# Patient Record
Sex: Male | Born: 1945 | Hispanic: Yes | Marital: Married | Smoking: Former smoker
Health system: Southern US, Community
[De-identification: ages and names within clinical notes are randomized; demographics above are authoritative.]

## PROBLEM LIST (undated history)

## (undated) DIAGNOSIS — I10 Essential (primary) hypertension: Secondary | ICD-10-CM

## (undated) DIAGNOSIS — E785 Hyperlipidemia, unspecified: Secondary | ICD-10-CM

## (undated) DIAGNOSIS — E119 Type 2 diabetes mellitus without complications: Secondary | ICD-10-CM

## (undated) DIAGNOSIS — J449 Chronic obstructive pulmonary disease, unspecified: Secondary | ICD-10-CM

## (undated) DIAGNOSIS — I639 Cerebral infarction, unspecified: Secondary | ICD-10-CM

## (undated) HISTORY — DX: Cerebral infarction, unspecified: I63.9

---

## 2017-04-29 ENCOUNTER — Emergency Department (HOSPITAL_COMMUNITY): Payer: Medicare Other

## 2017-04-29 ENCOUNTER — Encounter (HOSPITAL_COMMUNITY): Payer: Self-pay | Admitting: Emergency Medicine

## 2017-04-29 ENCOUNTER — Inpatient Hospital Stay (HOSPITAL_COMMUNITY)
Admission: EM | Admit: 2017-04-29 | Discharge: 2017-05-01 | DRG: 065 | Disposition: A | Discharge status: Home / Self Care | Payer: Medicare Other | Attending: Internal Medicine | Admitting: Internal Medicine

## 2017-04-29 DIAGNOSIS — E119 Type 2 diabetes mellitus without complications: Secondary | ICD-10-CM

## 2017-04-29 DIAGNOSIS — I6523 Occlusion and stenosis of bilateral carotid arteries: Secondary | ICD-10-CM | POA: Diagnosis present

## 2017-04-29 DIAGNOSIS — E1165 Type 2 diabetes mellitus with hyperglycemia: Secondary | ICD-10-CM | POA: Diagnosis present

## 2017-04-29 DIAGNOSIS — I639 Cerebral infarction, unspecified: Secondary | ICD-10-CM | POA: Diagnosis present

## 2017-04-29 DIAGNOSIS — Z7982 Long term (current) use of aspirin: Secondary | ICD-10-CM | POA: Diagnosis not present

## 2017-04-29 DIAGNOSIS — I63512 Cerebral infarction due to unspecified occlusion or stenosis of left middle cerebral artery: Principal | ICD-10-CM | POA: Diagnosis present

## 2017-04-29 DIAGNOSIS — Z87891 Personal history of nicotine dependence: Secondary | ICD-10-CM

## 2017-04-29 DIAGNOSIS — Z6829 Body mass index (BMI) 29.0-29.9, adult: Secondary | ICD-10-CM

## 2017-04-29 DIAGNOSIS — R29703 NIHSS score 3: Secondary | ICD-10-CM | POA: Diagnosis present

## 2017-04-29 DIAGNOSIS — E785 Hyperlipidemia, unspecified: Secondary | ICD-10-CM | POA: Diagnosis present

## 2017-04-29 DIAGNOSIS — I10 Essential (primary) hypertension: Secondary | ICD-10-CM | POA: Diagnosis present

## 2017-04-29 DIAGNOSIS — R4701 Aphasia: Secondary | ICD-10-CM | POA: Diagnosis present

## 2017-04-29 DIAGNOSIS — Z794 Long term (current) use of insulin: Secondary | ICD-10-CM | POA: Diagnosis not present

## 2017-04-29 DIAGNOSIS — Z79899 Other long term (current) drug therapy: Secondary | ICD-10-CM

## 2017-04-29 DIAGNOSIS — Z880 Allergy status to penicillin: Secondary | ICD-10-CM | POA: Diagnosis not present

## 2017-04-29 DIAGNOSIS — R29810 Facial weakness: Secondary | ICD-10-CM | POA: Diagnosis present

## 2017-04-29 DIAGNOSIS — R270 Ataxia, unspecified: Secondary | ICD-10-CM | POA: Diagnosis present

## 2017-04-29 DIAGNOSIS — J449 Chronic obstructive pulmonary disease, unspecified: Secondary | ICD-10-CM | POA: Diagnosis present

## 2017-04-29 DIAGNOSIS — E669 Obesity, unspecified: Secondary | ICD-10-CM | POA: Diagnosis present

## 2017-04-29 DIAGNOSIS — G8194 Hemiplegia, unspecified affecting left nondominant side: Secondary | ICD-10-CM | POA: Diagnosis present

## 2017-04-29 DIAGNOSIS — I6349 Cerebral infarction due to embolism of other cerebral artery: Secondary | ICD-10-CM

## 2017-04-29 HISTORY — DX: Hyperlipidemia, unspecified: E78.5

## 2017-04-29 HISTORY — DX: Chronic obstructive pulmonary disease, unspecified: J44.9

## 2017-04-29 HISTORY — DX: Essential (primary) hypertension: I10

## 2017-04-29 HISTORY — DX: Type 2 diabetes mellitus without complications: E11.9

## 2017-04-29 LAB — I-STAT CHEM 8, ED
BUN: 12 mg/dL (ref 6–20)
CREATININE: 1 mg/dL (ref 0.61–1.24)
Calcium, Ion: 1.11 mmol/L — ABNORMAL LOW (ref 1.15–1.40)
Chloride: 102 mmol/L (ref 101–111)
Glucose, Bld: 200 mg/dL — ABNORMAL HIGH (ref 65–99)
HEMATOCRIT: 40 % (ref 39.0–52.0)
HEMOGLOBIN: 13.6 g/dL (ref 13.0–17.0)
POTASSIUM: 3.9 mmol/L (ref 3.5–5.1)
SODIUM: 140 mmol/L (ref 135–145)
TCO2: 28 mmol/L (ref 22–32)

## 2017-04-29 LAB — COMPREHENSIVE METABOLIC PANEL
ALT: 25 U/L (ref 17–63)
ANION GAP: 11 (ref 5–15)
AST: 25 U/L (ref 15–41)
Albumin: 4.1 g/dL (ref 3.5–5.0)
Alkaline Phosphatase: 36 U/L — ABNORMAL LOW (ref 38–126)
BUN: 13 mg/dL (ref 6–20)
CHLORIDE: 102 mmol/L (ref 101–111)
CO2: 26 mmol/L (ref 22–32)
CREATININE: 1.02 mg/dL (ref 0.61–1.24)
Calcium: 8.8 mg/dL — ABNORMAL LOW (ref 8.9–10.3)
Glucose, Bld: 201 mg/dL — ABNORMAL HIGH (ref 65–99)
POTASSIUM: 3.9 mmol/L (ref 3.5–5.1)
SODIUM: 139 mmol/L (ref 135–145)
Total Bilirubin: 0.8 mg/dL (ref 0.3–1.2)
Total Protein: 7.2 g/dL (ref 6.5–8.1)

## 2017-04-29 LAB — CBC
HEMATOCRIT: 39.4 % (ref 39.0–52.0)
Hemoglobin: 13.8 g/dL (ref 13.0–17.0)
MCH: 31.4 pg (ref 26.0–34.0)
MCHC: 35 g/dL (ref 30.0–36.0)
MCV: 89.5 fL (ref 78.0–100.0)
PLATELETS: 273 10*3/uL (ref 150–400)
RBC: 4.4 MIL/uL (ref 4.22–5.81)
RDW: 13.1 % (ref 11.5–15.5)
WBC: 7.9 10*3/uL (ref 4.0–10.5)

## 2017-04-29 LAB — I-STAT TROPONIN, ED: TROPONIN I, POC: 0 ng/mL (ref 0.00–0.08)

## 2017-04-29 LAB — DIFFERENTIAL
BASOS PCT: 0 %
Basophils Absolute: 0 10*3/uL (ref 0.0–0.1)
EOS ABS: 0 10*3/uL (ref 0.0–0.7)
EOS PCT: 0 %
Lymphocytes Relative: 37 %
Lymphs Abs: 2.9 10*3/uL (ref 0.7–4.0)
MONO ABS: 0.5 10*3/uL (ref 0.1–1.0)
MONOS PCT: 7 %
NEUTROS ABS: 4.4 10*3/uL (ref 1.7–7.7)
Neutrophils Relative %: 56 %

## 2017-04-29 LAB — PROTIME-INR
INR: 0.97
PROTHROMBIN TIME: 12.8 s (ref 11.4–15.2)

## 2017-04-29 LAB — CBG MONITORING, ED: GLUCOSE-CAPILLARY: 188 mg/dL — AB (ref 65–99)

## 2017-04-29 LAB — APTT: APTT: 26 s (ref 24–36)

## 2017-04-29 MED ORDER — SENNOSIDES-DOCUSATE SODIUM 8.6-50 MG PO TABS: 1.0000 | ORAL_TABLET | Freq: Every evening | ORAL | Status: DC | PRN

## 2017-04-29 MED ORDER — ATORVASTATIN CALCIUM 40 MG PO TABS
40.0000 mg | ORAL_TABLET | Freq: Every day | ORAL | Status: DC
  Administered 2017-04-30 – 2017-05-01 (×2): 40 mg via ORAL
  Filled 2017-04-29 (×2): qty 1

## 2017-04-29 MED ORDER — SODIUM CHLORIDE 0.9 % IV SOLN
INTRAVENOUS | Status: DC
  Administered 2017-04-30 (×2): via INTRAVENOUS

## 2017-04-29 MED ORDER — ACETAMINOPHEN 650 MG RE SUPP: 650.0000 mg | RECTAL | Status: DC | PRN

## 2017-04-29 MED ORDER — STROKE: EARLY STAGES OF RECOVERY BOOK
Freq: Once | Status: AC
  Administered 2017-04-30: 02:00:00
  Filled 2017-04-29: qty 1

## 2017-04-29 MED ORDER — ENOXAPARIN SODIUM 40 MG/0.4ML ~~LOC~~ SOLN
40.0000 mg | SUBCUTANEOUS | Status: DC
  Administered 2017-04-30 (×2): 40 mg via SUBCUTANEOUS
  Filled 2017-04-29 (×2): qty 0.4

## 2017-04-29 MED ORDER — INSULIN ASPART 100 UNIT/ML ~~LOC~~ SOLN: 0.0000 [IU] | Freq: Every day | SUBCUTANEOUS | Status: DC

## 2017-04-29 MED ORDER — INSULIN ASPART 100 UNIT/ML ~~LOC~~ SOLN
0.0000 [IU] | Freq: Three times a day (TID) | SUBCUTANEOUS | Status: DC
  Administered 2017-04-30 – 2017-05-01 (×4): 2 [IU] via SUBCUTANEOUS
  Administered 2017-05-01: 5 [IU] via SUBCUTANEOUS
  Administered 2017-05-01: 3 [IU] via SUBCUTANEOUS

## 2017-04-29 MED ORDER — ASPIRIN EC 81 MG PO TBEC
81.0000 mg | DELAYED_RELEASE_TABLET | Freq: Every day | ORAL | Status: DC
  Administered 2017-04-30: 81 mg via ORAL
  Filled 2017-04-29: qty 1

## 2017-04-29 MED ORDER — ACETAMINOPHEN 160 MG/5ML PO SOLN: 650.0000 mg | ORAL | Status: DC | PRN

## 2017-04-29 MED ORDER — ACETAMINOPHEN 325 MG PO TABS: 650.0000 mg | ORAL_TABLET | ORAL | Status: DC | PRN

## 2017-04-29 NOTE — Care Management (Signed)
CM consult reviewed while patient. Patient is being admitted. Care Management will follow for discharge needs. Rubie Maidrystal Diania Co RN CCM

## 2017-04-29 NOTE — ED Notes (Signed)
Patient transported to MRI 

## 2017-04-29 NOTE — ED Notes (Signed)
Called Neurology Hospitalist @ 2018.

## 2017-04-29 NOTE — ED Notes (Signed)
MRI will get patient in roughly 30 minutes

## 2017-04-29 NOTE — ED Notes (Signed)
Attempted to call [facility] (third attempt)

## 2017-04-29 NOTE — ED Notes (Signed)
Attempted to call report to [facility] (second attempt)

## 2017-04-29 NOTE — H&P (Signed)
History and Physical    Mario Sullivan ZOX:[id number] DOB: 1945-08-07 DOA: 04/29/2017  PCP: Maye Hidesaul Canchari-Ames, PA (previously seen at Urgent Care) Consultants:  None Patient coming from:  Home - lives with wife, son, and granddaughter; Jackey LogeOK:  Shari HeritageSon, [phone]  Chief Complaint: weakness, dysarthria  HPI: Mario Sullivan is a 71 y.o. male with medical history significant of HTN, HLD, and DM presenting with symptoms concerning for CVA.  He is Spanish-speaking and history was obtained from his son and via the tele-interpreter.  Last Saturday, they went to his doctor - he was coughing a lot and had lots of phlegm.  He was given medication (prednisone, Tessalon, and Guaifenesin).  Yesterday, they were working outside and he was doing perfectly well.  His son went to his own room for bed about 7pm.  His mother woke up the son this AM about 0300 because the patient felt sick - he was unable to speak and unable to stand.  The [facility] took [facility] to [facility] and they did a CXR and diagnosed him with bronchitis.  He was given medication (Azithromycin) and sent home at 0500.  His son took him home because pharmacies were closed; he went back to get the medications, gave him the drugs, and went back to bed.  He slept but when he awoke his father still felt bad at about 1200.  The son took him back to the doctor and they sent him to the ER for concern of stroke.    At this time, he still having difficulty speaking and difficulty standing on his feet.  Regarding dysarthria, it appears that the issue is slurring (although this is obtained through the interpreter and it is hard to know for sure).  Also possible expressive aphasia.  This is the same problem that he has had since 3am.  RLE and RUE weakness.  This AM, he was drinking some water with his right hand and it felt weak.  He had difficulty with both the cracker and water but was able to eat them; his nurse reports that he passed his swallow  evaluation.  He did not take ASA the last 2 days although he previously was taking it; he ddn't take it because of the URI medications he was prescribed.  He was recently diagnosed with DM.     ED Course:  No tPA.  Negative CT but MRI positive for CVA.  Neurology [facility].  Review of Systems: As per HPI; otherwise review of systems reviewed and negative.   Ambulatory Status:  Ambulated without assistance prior to today  Past Medical History:  Diagnosis Date  . COPD (chronic obstructive pulmonary disease) (HCC)   . Diabetes mellitus type 2 in nonobese (HCC)   . Hyperlipidemia   . Hypertension     History reviewed. No pertinent surgical history.  Social History   Social History  . Marital status: Married    Spouse name: N/A  . Number of children: N/A  . Years of education: N/A   Occupational History  . retired    Social History Main Topics  . Smoking status: Former Smoker    Years: 30.00    Quit date: 141980  . Smokeless tobacco: Never Used  . Alcohol use Yes     Comment: rare; h/o heavy use  . Drug use: No  . Sexual activity: Not on file   Other Topics Concern  . Not on file   Social History Narrative  . No narrative  on file    Allergies  Allergen Reactions  . Penicillins     Has patient had a PCN reaction causing immediate rash, facial/tongue/throat swelling, SOB or lightheadedness with hypotension: Unknown Has patient had a PCN reaction causing severe rash involving mucus membranes or skin necrosis: Unknown Has patient had a PCN reaction that required hospitalization: Unknown Has patient had a PCN reaction occurring within the last 10 years: Unknown If all of the above answers are "NO", then may proceed with Cephalosporin use.     Family History  Problem Relation Age of Onset  . CVA Neg Hx     Prior to Admission medications   Medication Sig Start Date End Date Taking? Authorizing Provider  albuterol (PROAIR HFA) 108 (90  Base) MCG/ACT inhaler Inhale 2 puffs into the lungs 2 (two) times daily.   Yes [provider]  aspirin 81 MG tablet Take 81 mg by mouth daily.   Yes [provider]  azithromycin (ZITHROMAX) 250 MG tablet Take 250 mg by mouth daily. Take 2 tablets by mouth on first day, then take one tablet daily for 4 days   Yes [provider]  benzonatate (TESSALON) 200 MG capsule Take 200 mg by mouth 3 (three) times daily as needed for cough.   Yes [provider]  guaiFENesin (MUCINEX) 600 MG 12 hr tablet Take 600 mg by mouth 2 (two) times daily.   Yes [provider]  Guaifenesin-Codeine (VIRTUSSIN A/C PO) Take 10 mLs by mouth. TAKE 10 ML BY MOUTH EVERY 6 TO 8 HOURS AS NEEDED FOR COUGH   Yes [provider]  lisinopril (PRINIVIL,ZESTRIL) 10 MG tablet Take 10 mg by mouth daily.   Yes [provider]  lovastatin (MEVACOR) 10 MG tablet Take 10 mg by mouth daily.   Yes [provider]  metFORMIN (GLUCOPHAGE) 1000 MG tablet Take 1,000 mg by mouth 2 (two) times daily with a meal.   Yes [provider]  predniSONE (DELTASONE) 20 MG tablet Take 40 mg by mouth daily.   Yes [provider]    Physical Exam: Vitals:   04/29/17 2011 04/29/17 2048 04/29/17 2130 04/29/17 2233  BP:  (!) 142/77 (!) 150/77 (!) 143/66  Pulse:  74 64 (!) 58  Resp:  18 (!) 22 20  Temp: 98 F (36.7 C)   98.9 F (37.2 C)  TempSrc:    Oral  SpO2:  100% 99% 99%  Weight:    68.7 kg (151 lb 6.4 oz)  Height:    5' (1.524 m)     General:  Appears calm and comfortable and is NAD Eyes:  PERRL, EOMI, normal lids, iris ENT:  grossly normal hearing, lips & tongue, mmm; there is mild tongue deviation to the right Neck:  no LAD, masses or thyromegaly; no carotid bruits (vs. Very mild bruit on the left) Cardiovascular:  RRR, no m/r/g. No LE edema.  Respiratory:   CTA bilaterally with no wheezes/rales/rhonchi.  Normal respiratory effort. Abdomen:  soft, NT,  ND, NABS Skin:  no rash or induration seen on limited exam Musculoskeletal:  RLE weakness particularly of the foot with flexion/extension but present through the hip girdle; also with RUE weakness although grip strength is preserved Lower extremity:  No LE edema.  Limited foot exam with no ulcerations.  2+ distal pulses. Psychiatric:  grossly normal mood and affect, speech fluent and appropriate - possibly mildly slurred with intermittent mild aphasia, but this is extremely hard to interpret due to the  language barrier, AOx3 Neurologic:  CN 2-12 grossly intact other than mild V3 weakness on the right, moves all extremities in coordinated fashion, sensation diminished on the left face (all distributions) and arm, leg    Radiological Exams on Admission: Ct Head Wo Contrast  Result Date: 04/29/2017 CLINICAL DATA:  Slurred speech, left-sided weakness and fatigue. EXAM: CT HEAD WITHOUT CONTRAST TECHNIQUE: Contiguous axial images were obtained from the base of the skull through the vertex without intravenous contrast. COMPARISON:  None. FINDINGS: Brain: There is evidence of a probable subacute deep infarct involving the superior aspect of the left lentiform nucleus and extending up towards the corona radiata. No associated hemorrhage or visible mass-effect. No hydrocephalus. No extra-axial fluid collections. Vascular: No hyperdense vessel or unexpected calcification. Skull: Normal. Negative for fracture or focal lesion. Sinuses/Orbits: No acute finding. Other: None. IMPRESSION: Probable subacute infarct involving the basal ganglia at the level of the left lentiform nucleus and extending superiorly up towards the corona radiata. No evidence of associated hemorrhage. Electronically Signed   By: Irish Lack M.D.   On: 04/29/2017 17:06   Mr Brain Wo Contrast (neuro Protocol)  Result Date: 04/29/2017 CLINICAL DATA:  71 y/o  M; stroke-like symptoms. EXAM: MRI HEAD WITHOUT CONTRAST TECHNIQUE: Multiplanar,  multiecho pulse sequences of the brain and surrounding structures were obtained without intravenous contrast. COMPARISON:  04/29/2017 CT of the head. FINDINGS: Brain: Area of reduced diffusion involving the left superior putamen, caudate head, and caudate body measuring 3.4 x 1.2 x 2.1 cm (volume = 4.5 cm^3) (AP x ML x CC series 3, image 34 and series 5, image 18) compatible with acute/early subacute infarction. The infarct demonstrates associated T2 FLAIR hyperintense signal abnormality. No abnormal susceptibility to indicate intracranial hemorrhage. Few nonspecific foci of T2 FLAIR hyperintense signal abnormality in subcortical and periventricular white matter are compatible with mild chronic microvascular ischemic changes for age. Mild brain parenchymal volume loss. Small chronic infarct within the right lateral cerebellar hemisphere. No hydrocephalus, extra-axial collection, or effacement of basilar cisterns. Vascular: Normal flow voids. Skull and upper cervical spine: Normal marrow signal. Sinuses/Orbits: Negative. Other: None. IMPRESSION: 1. Small acute/early subacute infarction involving left superior putamen and caudate nucleus. No acute hemorrhage. 2. Mild chronic microvascular ischemic changes and mild parenchymal volume loss of the brain. These results were called by telephone at the time of interpretation on 04/29/2017 at 7:54 pm to Dr. Lorre Nick , who verbally acknowledged these results. Electronically Signed   By: Mitzi Hansen M.D.   On: 04/29/2017 19:56    EKG: Independently reviewed.  NSR with rate 66; nonspecific ST changes with no evidence of acute ischemia   Labs on Admission: I have personally reviewed the available labs and imaging studies at the time of the admission.  Pertinent labs:   Glucose 188, 200 Troponin 0.00   Assessment/Plan Principal Problem:   CVA (cerebral vascular accident) (HCC) Active Problems:   Essential hypertension   Diabetes mellitus, type  2 (HCC)   Hyperlipidemia   CVA -Symptoms quite concerning for CVA; unfortunately, while he may have been a tPA candidate with his initial presentation, he is not currently (I suspect that the language barrier contributed to the issue) -MRI is positive for CVA -Will admit to [facility] for further CVA/TIA evaluation -Telemetry monitoring -MRA -Carotid dopplers -Echo -Risk stratification with FLP, A1c; will also check TSH and UDS -ASA daily (while he was taking this daily, he missed the last few days.  It is my understanding that with sudden  withdrawal of ASA there can be a rebound effect that increases the risk of CVA transiently). -PT/OT/ST/Nutrition Consults -SW consult for placement - he appears to be an excellent candidate for inpatient rehab -Neurology consult upon arrival at [facility]  HTN -Allow permissive HTN -Treat BP only if >220/120, and then with goal of 15% reduction -Hold  Lisinopril and plan to restart in 48-72 hours  HLD -Check FLP -Change Mevacor to Lipitor 40 mg daily empirically for now  DM -Will check A1c -hold Glucophage -Cover with moderate-scale SSI   DVT prophylaxis: Lovenox  Code Status:  Full - confirmed with patient/family Family Communication: Son present throughout evaluation  Disposition Plan:  To be determined - recommend inpatient rehab if eligible Consults called: Neurology; PT/OT/ST/Nutrition/SW  Admission status: Admit - It is my clinical opinion that admission to INPATIENT is reasonable and necessary because this patient will require at least 2 midnights in the hospital to treat this condition based on the medical complexity of the problems presented.  Given the aforementioned information, the predictability of an adverse outcome is felt to be significant.    Jonah Blue MD [facility]  If note is complete, please contact covering daytime or nighttime physician. [email or web address] Password [email or web address]  04/29/2017, 11:23 PM

## 2017-04-29 NOTE — ED Notes (Signed)
[facility] contacted for transport to [place] 

## 2017-04-29 NOTE — ED Triage Notes (Signed)
Pt comes in with son with complaints of stroke like symptoms that began around 0300 this morning.  Was seen and diagnosed with acute bronchitis recently.  Pt went and saw spanish speaking doctor and was sent over here urgently to rule out stroke. According to son patient is slurring his speech and has left sided weakness with increased fatigue. Pt presents with equal motor skills bilaterally for this RN.  Pt does have some slight asymmetry to smile.

## 2017-04-29 NOTE — ED Notes (Signed)
Attempted to call report to [facility]

## 2017-04-29 NOTE — ED Provider Notes (Signed)
[facility]-EMERGENCY DEPT Provider Note   CSN: [id number] Arrival date & time: 04/29/17  1612     History   Chief Complaint Chief Complaint  Patient presents with  . Stroke Like Symptoms    HPI Mario Sullivan is a 71 y.o. male.  71 year old male presents with ataxia, slurred speech, right-sided weakness since approximately 3 AM today.  Went to an outside hospital and was diagnosed with bronchitis.  Of note, he did not have any cough, fever, chest pain.  He was prescribed azithromycin as well as cough medication and discharged home.  His son states that he continues to be ataxic when he walks and speech is garbled.  He does appear to understand him his son states.  No reported confusion, emesis.  He has not had any falls.  He denied any visual changes.  Symptoms have been persistent and nothing makes them better.  No treatment use prior to arrival      Past Medical History:  Diagnosis Date  . COPD (chronic obstructive pulmonary disease) (HCC)     There are no active problems to display for this patient.   History reviewed. No pertinent surgical history.     Home Medications    Prior to Admission medications   Medication Sig Start Date End Date Taking? Authorizing Provider  azithromycin (ZITHROMAX) 250 MG tablet Take 250 mg by mouth daily. Take 2 tablets by mouth on first day, then take one tablet daily for 4 days   Yes [provider]  Guaifenesin-Codeine (VIRTUSSIN A/C PO) Take 10 mLs by mouth. TAKE 10 ML BY MOUTH EVERY 6 TO 8 HOURS AS NEEDED FOR COUGH   Yes [provider]  metFORMIN (GLUCOPHAGE) 1000 MG tablet Take 1,000 mg by mouth 2 (two) times daily with a meal.   Yes [provider]    Family History No family history on file.  Social History Social History  Substance Use Topics  . Smoking status: Former Games developer  . Smokeless tobacco: Never Used  . Alcohol use No     Allergies   Penicillins   Review  of Systems Review of Systems  All other systems reviewed and are negative.    Physical Exam Updated Vital Signs BP (!) 154/67 (BP Location: Left Arm)   Pulse 69   Temp 97.8 F (36.6 C) (Oral)   Resp 16   SpO2 97%   Physical Exam  Constitutional: He is oriented to person, place, and time. He appears well-developed and well-nourished.  Non-toxic appearance. No distress.  HENT:  Head: Normocephalic and atraumatic.  Eyes: Pupils are equal, round, and reactive to light. Conjunctivae, EOM and lids are normal.  Neck: Normal range of motion. Neck supple. No tracheal deviation present. No thyroid mass present.  Cardiovascular: Normal rate, regular rhythm and normal heart sounds.  Exam reveals no gallop.   No murmur heard. Pulmonary/Chest: Effort normal and breath sounds normal. No stridor. No respiratory distress. He has no decreased breath sounds. He has no wheezes. He has no rhonchi. He has no rales.  Abdominal: Soft. Normal appearance and bowel sounds are normal. He exhibits no distension. There is no tenderness. There is no rebound and no CVA tenderness.  Musculoskeletal: Normal range of motion. He exhibits no edema or tenderness.  Neurological: He is alert and oriented to person, place, and time. He has normal strength. No cranial nerve deficit or sensory deficit. GCS eye subscore is 4. GCS verbal subscore is 5. GCS motor subscore is 6.  Right upper and right lower extremity strength is 4/ 5 No facial asymmetry, speech is not garbled.  Skin: Skin is warm and dry. No abrasion and no rash noted.  Psychiatric: His affect is blunt. His speech is delayed. He is slowed.  Nursing note and vitals reviewed.    ED Treatments / Results  Labs (all labs ordered are listed, but only abnormal results are displayed) Labs Reviewed  CBG MONITORING, ED - Abnormal; Notable for the following:       Result Value   Glucose-Capillary 188 (*)    All other components within normal limits  I-STAT CHEM 8,  ED - Abnormal; Notable for the following:    Glucose, Bld 200 (*)    Calcium, Ion 1.11 (*)    All other components within normal limits  CBC  DIFFERENTIAL  PROTIME-INR  APTT  COMPREHENSIVE METABOLIC PANEL  I-STAT TROPONIN, ED    EKG  EKG Interpretation  Date/Time:  Monday April 29 2017 16:33:25 EDT Ventricular Rate:  66 PR Interval:    QRS Duration: 90 QT Interval:  385 QTC Calculation: 404 R Axis:   62 Text Interpretation:  Sinus rhythm Borderline low voltage, extremity leads No old tracing to compare Confirmed by Lorre NickAllen, Avaree Gilberti ([id number]) on 04/29/2017 5:06:28 PM       Radiology No results found.  Procedures Procedures (including critical care time)  Medications Ordered in ED Medications - No data to display   Initial Impression / Assessment and Plan / ED Course  I have reviewed the triage vital signs and the nursing notes.  Pertinent labs & imaging results that were available during my care of the patient were reviewed by me and considered in my medical decision making (see chart for details).     Patient is a Van negative.  Symptoms began more than 4-1/2 hours ago.  Head CT negative but MRI does show positive CVA.  Discussed with Dr. Amada JupiterKirkpatrick who [facility] and I will contact the hospitalist here  Final Clinical Impressions(s) / ED Diagnoses   Final diagnoses:  None    New Prescriptions New Prescriptions   No medications on file     Lorre NickAllen, Bani Gianfrancesco, MD 04/29/17 2029

## 2017-04-29 NOTE — ED Notes (Signed)
ED Provider at bedside. 

## 2017-04-30 ENCOUNTER — Inpatient Hospital Stay (HOSPITAL_COMMUNITY): Payer: Medicare Other

## 2017-04-30 DIAGNOSIS — I6789 Other cerebrovascular disease: Secondary | ICD-10-CM

## 2017-04-30 DIAGNOSIS — I63312 Cerebral infarction due to thrombosis of left middle cerebral artery: Secondary | ICD-10-CM

## 2017-04-30 LAB — ECHOCARDIOGRAM COMPLETE
AVLVOTPG: 8 mmHg
CHL CUP DOP CALC LVOT VTI: 34.1 cm
EERAT: 12.99
FS: 23 % — AB (ref 28–44)
Height: 60 in
IVS/LV PW RATIO, ED: 1.08
LA ID, A-P, ES: 33 mm
LA diam index: 1.91 cm/m2
LA vol A4C: 60.4 ml
LA vol index: 35.4 mL/m2
LA vol: 61.2 mL
LDCA: 3.14 cm2
LEFT ATRIUM END SYS DIAM: 33 mm
LV E/e'average: 12.99
LV PW d: 12 mm — AB (ref 0.6–1.1)
LV TDI E'LATERAL: 8.16
LVEEMED: 12.99
LVELAT: 8.16 cm/s
LVOT diameter: 20 mm
LVOTPV: 144 cm/s
LVOTSV: 107 mL
MV Peak grad: 4 mmHg
MV pk A vel: 121 m/s
MV pk E vel: 106 m/s
RV LATERAL S' VELOCITY: 15.7 cm/s
RV TAPSE: 24.4 mm
TDI e' medial: 7.62
Weight: 2422.4 oz

## 2017-04-30 LAB — BASIC METABOLIC PANEL
Anion gap: 9 (ref 5–15)
BUN: 11 mg/dL (ref 6–20)
CHLORIDE: 104 mmol/L (ref 101–111)
CO2: 26 mmol/L (ref 22–32)
Calcium: 8.6 mg/dL — ABNORMAL LOW (ref 8.9–10.3)
Creatinine, Ser: 0.97 mg/dL (ref 0.61–1.24)
Glucose, Bld: 139 mg/dL — ABNORMAL HIGH (ref 65–99)
POTASSIUM: 3.9 mmol/L (ref 3.5–5.1)
SODIUM: 139 mmol/L (ref 135–145)

## 2017-04-30 LAB — GLUCOSE, CAPILLARY
GLUCOSE-CAPILLARY: 134 mg/dL — AB (ref 65–99)
GLUCOSE-CAPILLARY: 141 mg/dL — AB (ref 65–99)
GLUCOSE-CAPILLARY: 176 mg/dL — AB (ref 65–99)
Glucose-Capillary: 146 mg/dL — ABNORMAL HIGH (ref 65–99)
Glucose-Capillary: 227 mg/dL — ABNORMAL HIGH (ref 65–99)
Glucose-Capillary: 145 mg/dL — ABNORMAL HIGH (ref 65–99)

## 2017-04-30 LAB — TROPONIN I

## 2017-04-30 LAB — LIPID PANEL
Cholesterol: 146 mg/dL (ref 0–200)
HDL: 43 mg/dL (ref >40)
LDL CALC: 76 mg/dL (ref 0–99)
TRIGLYCERIDES: 134 mg/dL (ref <150)
Total CHOL/HDL Ratio: 3.4 RATIO
VLDL: 27 mg/dL (ref 0–40)

## 2017-04-30 LAB — HEMOGLOBIN A1C
Hgb A1c MFr Bld: 7.9 % — ABNORMAL HIGH (ref 4.8–5.6)
Mean Plasma Glucose: 180.03 mg/dL

## 2017-04-30 MED ORDER — ORAL CARE MOUTH RINSE
15.0000 mL | Freq: Two times a day (BID) | OROMUCOSAL | Status: DC
  Administered 2017-04-30 – 2017-05-01 (×4): 15 mL via OROMUCOSAL

## 2017-04-30 MED ORDER — CHLORHEXIDINE GLUCONATE 0.12 % MT SOLN
15.0000 mL | Freq: Two times a day (BID) | OROMUCOSAL | Status: DC
Start: 2017-04-30 — End: 2017-05-01
  Administered 2017-04-30 – 2017-05-01 (×3): 15 mL via OROMUCOSAL
  Filled 2017-04-30 (×3): qty 15

## 2017-04-30 MED ORDER — ASPIRIN EC 325 MG PO TBEC
325.0000 mg | DELAYED_RELEASE_TABLET | Freq: Every day | ORAL | Status: DC
  Administered 2017-04-30 – 2017-05-01 (×2): 325 mg via ORAL
  Filled 2017-04-30 (×2): qty 1

## 2017-04-30 NOTE — Progress Notes (Signed)
[facility]                                                                              Patient Demographics  Mario Sullivan, is a 71 y.o. male, DOB - 07-27-1945, ZOX:[id number]  Admit date - 04/29/2017   Admitting Physician Jonah Blue, MD  Outpatient Primary MD for the patient is Kurtis Bushman, [place]  Outpatient specialists:   LOS - 1  days   Medical records reviewed and are as summarized below:    Chief Complaint  Patient presents with  . Stroke Like Symptoms       Brief summary   Patient is a 71 year old male with hypertension, hyperlipidemia, diabetes presented with generalized weakness, slurred speech.  Per patient's son he was recently diagnosed with bronchitis and improving on prednisone, Tessalon and guaifenesin.  On the morning of admission, patient was found to have slurred speech and difficulty standing on his feet.  Patient was admitted for further workup of stroke.   Assessment & Plan    Principal Problem:  acute CVA (cerebral vascular accident) (HCC) -Presented with slurred speech and weakness -MRI of the brain showed small acute/early subacute infarction involving the left superior putamen and caudate nucleus, no acute hemorrhage. -MRI of the brain showed severe stenosis versus focally occluded left M2 segments, occluded left M3 segment -Follow 2D echo, carotid Dopplers, PT, OT, ST evaluation -Currently on aspirin 325 mg daily, follow neurology recommendations -LDL 76, continue statin -Hemoglobin A1c 7.9  Active Problems:   Essential hypertension -Allow permissive hypertension, hold lisinopril, plan to restart in next 48-72 hours     Diabetes mellitus, type 2 (HCC) -Hold metformin, continue sliding scale insulin -Hemoglobin A1c 9.9    Hyperlipidemia -Lipid panel showed LDL 76, continue Lipitor for now  Code Status:  DVT Prophylaxis:  Lovenox  Family Communication: Discussed in detail with the patient, all imaging  results, lab results explained to the patient and son at the bedside   Disposition Plan:   Time Spent in minutes   *25 minutes  Procedures:    Consultants:   neurology  Antimicrobials:      Medications  Scheduled Meds: . aspirin EC  325 mg Oral Daily  . atorvastatin  40 mg Oral q1800  . chlorhexidine  15 mL Mouth Rinse BID  . enoxaparin (LOVENOX) injection  40 mg Subcutaneous Q24H  . insulin aspart  0-15 Units Subcutaneous TID WC  . insulin aspart  0-5 Units Subcutaneous QHS  . mouth rinse  15 mL Mouth Rinse q12n4p   Continuous Infusions: . sodium chloride 50 mL/hr at 04/30/17 0139   PRN Meds:.acetaminophen **OR** acetaminophen (TYLENOL) oral liquid 160 mg/5 mL **OR** acetaminophen, senna-docusate   Antibiotics   Anti-infectives    None        Subjective:   Mario Sullivan was seen and examined today.  Speech is slow but improving per son at the bedside.  Mild weakness on the right.  Patient denies dizziness, chest pain, shortness of breath, abdominal pain, N/V/D/C.  No acute events overnight.    Objective:   Vitals:   04/30/17 0100 04/30/17 0300 04/30/17 0520 04/30/17 0845  BP: 129/63 127/65 (!) 145/64 (!) 143/63  Pulse: (!) 58 (!) 58 60 74  Resp: 16 14 16 16   Temp: 98.4 F (36.9 C) 98.9 F (37.2 C) (!) 97.5 F (36.4 C) 98.3 F (36.8 C)  TempSrc: Oral Oral Axillary Oral  SpO2: 97% 97% 98% 97%  Weight:      Height:       No intake or output data in the 24 hours ending 04/30/17 1132   Wt Readings from Last 3 Encounters:  04/29/17 68.7 kg (151 lb 6.4 oz)  04/29/17 67.1 kg (148 lb)     Exam  General: Alert and oriented x 3, NAD  Eyes:   HEENT:  Atraumatic, normocephalic  Cardiovascular: S1 S2 auscultated, no rubs, murmurs or gallops. Regular rate and rhythm.  Respiratory: Clear to auscultation bilaterally, no wheezing, rales or rhonchi  Gastrointestinal: Soft, nontender, nondistended, + bowel sounds  Ext: no pedal edema  bilaterally  Neuro: AAOx3, Cr N's II- XII. Strength 5/5 LUE, LLE. 4/5 on the right, speech improving  Musculoskeletal: No digital cyanosis, clubbing  Skin: No rashes  Psych: Normal affect and demeanor, alert and oriented x3    Data Reviewed:  I have personally reviewed following labs and imaging studies  Micro Results No results found for this or any previous visit (from the past 240 hour(s)).  Radiology Reports Ct Head Wo Contrast  Result Date: 04/29/2017 CLINICAL DATA:  Slurred speech, left-sided weakness and fatigue. EXAM: CT HEAD WITHOUT CONTRAST TECHNIQUE: Contiguous axial images were obtained from the base of the skull through the vertex without intravenous contrast. COMPARISON:  None. FINDINGS: Brain: There is evidence of a probable subacute deep infarct involving the superior aspect of the left lentiform nucleus and extending up towards the corona radiata. No associated hemorrhage or visible mass-effect. No hydrocephalus. No extra-axial fluid collections. Vascular: No hyperdense vessel or unexpected calcification. Skull: Normal. Negative for fracture or focal lesion. Sinuses/Orbits: No acute finding. Other: None. IMPRESSION: Probable subacute infarct involving the basal ganglia at the level of the left lentiform nucleus and extending superiorly up towards the corona radiata. No evidence of associated hemorrhage. Electronically Signed   By: Irish Lack M.D.   On: 04/29/2017 17:06   Mr Brain Wo Contrast (neuro Protocol)  Result Date: 04/29/2017 CLINICAL DATA:  71 y/o  M; stroke-like symptoms. EXAM: MRI HEAD WITHOUT CONTRAST TECHNIQUE: Multiplanar, multiecho pulse sequences of the brain and surrounding structures were obtained without intravenous contrast. COMPARISON:  04/29/2017 CT of the head. FINDINGS: Brain: Area of reduced diffusion involving the left superior putamen, caudate head, and caudate body measuring 3.4 x 1.2 x 2.1 cm (volume = 4.5 cm^3) (AP x ML x CC series 3, image  34 and series 5, image 18) compatible with acute/early subacute infarction. The infarct demonstrates associated T2 FLAIR hyperintense signal abnormality. No abnormal susceptibility to indicate intracranial hemorrhage. Few nonspecific foci of T2 FLAIR hyperintense signal abnormality in subcortical and periventricular white matter are compatible with mild chronic microvascular ischemic changes for age. Mild brain parenchymal volume loss. Small chronic infarct within the right lateral cerebellar hemisphere. No hydrocephalus, extra-axial collection, or effacement of basilar cisterns. Vascular: Normal flow voids. Skull and upper cervical spine: Normal marrow signal. Sinuses/Orbits: Negative. Other: None. IMPRESSION: 1. Small acute/early subacute infarction involving left superior putamen and caudate nucleus. No acute hemorrhage. 2. Mild chronic microvascular ischemic changes and mild parenchymal volume loss of the brain. These results were called by telephone at the time of interpretation on 04/29/2017 at 7:54  pm to Dr. Lorre NickANTHONY ALLEN , who verbally acknowledged these results. Electronically Signed   By: Mitzi HansenLance  Furusawa-Stratton M.D.   On: 04/29/2017 19:56   Mr Maxine GlennMra Head Wo Contrast  Result Date: 04/30/2017 CLINICAL DATA:  Follow-up stroke. EXAM: MRA HEAD WITHOUT CONTRAST TECHNIQUE: Angiographic images of the Circle of Willis were obtained using MRA technique without intravenous contrast. COMPARISON:  MRI of the head April 29, 2017 FINDINGS: ANTERIOR CIRCULATION: Normal flow related enhancement of the included cervical, petrous, cavernous and supraclinoid internal carotid arteries. Patent anterior communicating artery. Poor flow related artifact LEFT M2 inferior division though, normal caliber. Focal loss of LEFT M2 superior division flow related enhancement, patent distally. Occluded LEFT M3 segment within anterior sylvian fissure. Patent anterior cerebral artery's. No aneurysm. POSTERIOR CIRCULATION: RIGHT  vertebral artery is dominant. Basilar artery is patent, with normal flow related enhancement of the main branch vessels. Patent posterior cerebral arteries. LEFT posterior communicating artery infundibulum. No large vessel occlusion, flow limiting stenosis,  aneurysm. ANATOMIC VARIANTS: None. Source images and MIP images were reviewed. IMPRESSION: 1. Severe stenosis versus focally occluded LEFT M2 segments, possibly overestimated by tortuosity. 2. Occluded LEFT M3 segment. Electronically Signed   By: Awilda Metroourtnay  Bloomer M.D.   On: 04/30/2017 02:02    Lab Data:  CBC:  Recent Labs Lab 04/29/17 1632 04/29/17 1644  WBC 7.9  --   NEUTROABS 4.4  --   HGB 13.8 13.6  HCT 39.4 40.0  MCV 89.5  --   PLT 273  --    Basic Metabolic Panel:  Recent Labs Lab 04/29/17 1632 04/29/17 1644 04/30/17 0712  NA 139 140 139  K 3.9 3.9 3.9  CL 102 102 104  CO2 26  --  26  GLUCOSE 201* 200* 139*  BUN 13 12 11   CREATININE 1.02 1.00 0.97  CALCIUM 8.8*  --  8.6*   GFR: Estimated Creatinine Clearance: 57.6 mL/min (by C-G formula based on SCr of 0.97 mg/dL). Liver Function Tests:  Recent Labs Lab 04/29/17 1632  AST 25  ALT 25  ALKPHOS 36*  BILITOT 0.8  PROT 7.2  ALBUMIN 4.1   No results for input(s): LIPASE, AMYLASE in the last 168 hours. No results for input(s): AMMONIA in the last 168 hours. Coagulation Profile:  Recent Labs Lab 04/29/17 1632  INR 0.97   Cardiac Enzymes:  Recent Labs Lab 04/30/17 0116  TROPONINI <0.03   BNP (last 3 results) No results for input(s): PROBNP in the last 8760 hours. HbA1C:  Recent Labs  04/30/17 0116  HGBA1C 7.9*   CBG:  Recent Labs Lab 04/29/17 1641 04/30/17 0116 04/30/17 0447 04/30/17 0745 04/30/17 1122  GLUCAP 188* 146* 227* 134* 141*   Lipid Profile:  Recent Labs  04/30/17 0351  CHOL 146  HDL 43  LDLCALC 76  TRIG 134  CHOLHDL 3.4   Thyroid Function Tests: No results for input(s): TSH, T4TOTAL, FREET4, T3FREE, THYROIDAB  in the last 72 hours. Anemia Panel: No results for input(s): VITAMINB12, FOLATE, FERRITIN, TIBC, IRON, RETICCTPCT in the last 72 hours. Urine analysis: No results found for: COLORURINE, APPEARANCEUR, LABSPEC, PHURINE, GLUCOSEU, HGBUR, BILIRUBINUR, KETONESUR, PROTEINUR, UROBILINOGEN, NITRITE, LEUKOCYTESUR   Ripudeep Rai M.D. [facility] 04/30/2017, 11:32 AM  Pager: [phone] Between 7am to 7pm - call Pager - [phone]  After 7pm go to [email or web address] - password [email or web address]  Call night coverage person covering after 7pm

## 2017-04-30 NOTE — Consult Note (Signed)
Neurology Consultation Reason for Consult: Ischemic stroke Referring Physician: Basil Dess  CC: Ischemic stroke  History is obtained from: Patient via family member  HPI: Mario Sullivan is a 71 y.o. male who has been treated for bronchitis recently.  He was in his normal state of health until yesterday when he went to bed.  He woke up around 3 AM, it was noted that he was slurring his speech.  This persisted and he went to his PCP today who referred him to the emergency department for concern for stroke.  An MRI was performed which does show a deep stroke on the left.   LKW: 10/29 prior to bed tpa given?: no, out of window  ROS: A 14 point ROS was performed and is negative except as noted in the HPI.  Unable to obtain due to altered mental status.   Past Medical History:  Diagnosis Date  . COPD (chronic obstructive pulmonary disease) (HCC)   . Diabetes mellitus type 2 in nonobese (HCC)   . Hyperlipidemia   . Hypertension      Family History  Problem Relation Age of Onset  . CVA Neg Hx      Social History:  reports that he quit smoking about 38 years ago. He quit after 30.00 years of use. He has never used smokeless tobacco. He reports that he drinks alcohol. He reports that he does not use drugs.   Exam: Current vital signs: BP 129/63 (BP Location: Left Arm)   Pulse (!) 58   Temp 98.4 F (36.9 C) (Oral)   Resp 16   Ht 5' (1.524 m)   Wt 68.7 kg (151 lb 6.4 oz)   SpO2 97%   BMI 29.57 kg/m  Vital signs in last 24 hours: Temp:  [97.8 F (36.6 C)-98.9 F (37.2 C)] 98.4 F (36.9 C) (10/30 0100) Pulse Rate:  [58-74] 58 (10/30 0100) Resp:  [15-22] 16 (10/30 0100) BP: (129-154)/(63-86) 129/63 (10/30 0100) SpO2:  [97 %-100 %] 97 % (10/30 0100) Weight:  [67.1 kg (148 lb)-68.7 kg (151 lb 6.4 oz)] 68.7 kg (151 lb 6.4 oz) (10/29 2233)   Physical Exam  Constitutional: Appears well-developed and well-nourished.  Psych: Affect appropriate to situation Eyes: No scleral  injection HENT: No OP obstrucion Head: Normocephalic.  Cardiovascular: Normal rate and regular rhythm.  Respiratory: Effort normal and breath sounds normal to anterior ascultation GI: Soft.  No distension. There is no tenderness.  Skin: WDI  Neuro: Mental Status: Patient is awake, alert, oriented to person, place, month, year, and situation. Patient is able to give a clear and coherent history. No signs of aphasia or neglect Cranial Nerves: II: Visual Fields are full. Pupils are equal, round, and reactive to light.   III,IV, VI: EOMI without ptosis or diploplia.  V: Facial sensation is decreased on the right.  VII: Facial movement is with mild right facial weakness VIII: hearing is intact to voice X: Uvula elevates symmetrically XI: Shoulder shrug is symmetric. XII: tongue is midline without atrophy or fasciculations.  Motor: Tone is normal. Bulk is normal. 5/5 strength was present on the left, mildly weak 5-/5 on the right.  Sensory: Sensation is diminished throughout the right side.  Cerebellar: FNF  intact bilaterally   I have reviewed labs in epic and the results pertinent to this consultation are: cmp - elevated glucose.   I have reviewed the images obtained:MRI brain  - subcortical infarct on the left.   Impression: 71 yo M with cortical infarct on  the left.  He will need further workup and evaluation for rehab.  Recommendations: 1. HgbA1c, fasting lipid panel 2. MRI, MRA  of the brain without contrast 3. Frequent neuro checks 4. Echocardiogram 5. Carotid dopplers 6. Prophylactic therapy-Antiplatelet med: Aspirin - dose 325mg  PO or 300mg  PR 7. Risk factor modification 8. Telemetry monitoring 9. PT consult, OT consult, Speech consult 10. please page stroke NP  Or  PA  Or MD  from 8am -4 pm as this patient will be followed by the stroke team at this point.   You can look them up on [email or web address]     Ritta SlotMcNeill Jeramie Scogin, MD [facility] [phone]  If 7pm- 7am, please page neurology on call as listed in AMION.

## 2017-04-30 NOTE — Progress Notes (Signed)
Inpatient Rehabilitation  Per SLP request, patient was screened by Fae PippinMelissa Octa Uplinger for appropriateness for an Inpatient Acute Rehab consult.  At this time we will follow along for PT and OT recommendations, prior to requesting an Inpatient Rehab consult.  Call if questions.   Charlane FerrettiMelissa Khaliel Morey, M.A., CCC/SLP Admission Coordinator  [facility]  Cell [phone]

## 2017-04-30 NOTE — Progress Notes (Signed)
  Echocardiogram 2D Echocardiogram has been performed.  Roosvelt MaserLane, Vittorio Mohs F 04/30/2017, 4:02 PM

## 2017-04-30 NOTE — Progress Notes (Signed)
Carotid duplex  has been completed. Preliminary results can be found: Chart review -> CV Proc  Mario Sullivan, RDMS, RVT   

## 2017-04-30 NOTE — Progress Notes (Signed)
Nutrition Brief Note  Patient identified on the Malnutrition Screening Tool (MST) Report  Wt Readings from Last 15 Encounters:  04/29/17 151 lb 6.4 oz (68.7 kg)  04/29/17 148 lb (67.1 kg)    71 yo Male with PMH of HTN, HLD, DM presents with CVA  Spoke with Mr. Ardyth HarpsHernandez at bedside with aid of interpreter Eulis Fosterlberto [phone] Unsure of accuracy of information. States he normally eats bread with coffee for breakfast, tortillas and fruit for lunch, sometimes skips dinner, sometimes eats a cooked meal with starch, meat and vegetables. States he has loss 2 kgs recently, but has a good appetite and ate well for breakfast. Had a scrambled egg sub, breakfast potatoes, [place] bacon, oatmeal, skim milk. Discussed with RN. No apparent needs at this time.  Labs reviewed:  CBG 141, 134  Medications reviewed and include:  Novolog 0-15 units TID, 0-5 Units HS NS at 5650mL/hr  Body mass index is 29.57 kg/m. Patient meets criteria for overweigt based on current BMI.   Current diet order is heart healthy/carb modified, patient is consuming approximately 100% of meals at this time.  No nutrition interventions warranted at this time. If nutrition issues arise, please consult RD.   NUTRITION - FOCUSED PHYSICAL EXAM:    Most Recent Value  Orbital Region  No depletion  Upper Arm Region  No depletion  Thoracic and Lumbar Region  No depletion  Buccal Region  No depletion  Temple Region  No depletion  Clavicle Bone Region  No depletion  Clavicle and Acromion Bone Region  No depletion  Scapular Bone Region  No depletion  Dorsal Hand  No depletion  Patellar Region  No depletion  Anterior Thigh Region  No depletion  Posterior Calf Region  No depletion  Edema (RD Assessment)  None  Hair  Reviewed  Eyes  Reviewed  Mouth  Reviewed  Skin  Reviewed  Nails  Reviewed        Dionne AnoWilliam M. Sailor Hevia, MS, RD LDN Inpatient Clinical Dietitian Pager [phone]

## 2017-04-30 NOTE — Progress Notes (Signed)
OT Evaluation  PTA, pt independent with ADL and mobility. Pt presents with below deficits and will benefit from acute OT to maximize functional use of R dominant UE and independence with ADL and mobility. Recommend follow up with OT at the neuro outpt center. Pt will have 24/7 S at DC . Pt's son asking questions about stroke - son referred to stroke education materials in espanol. Son very Adult nurse.    04/30/17 1500  OT Visit Information  Last OT Received On 04/30/17  Assistance Needed +1  PT/OT/SLP Co-Evaluation/Treatment Yes  Reason for Co-Treatment Other (comment) (for interpreter services)  History of Present Illness Pt is a 71 y/o male with a PMH significant for COPD, DM, HTN.  He was in his normal state of health until yesterday when he went to bed.  He woke up around 3 AM, it was noted that he was slurring his speech.  This persisted and he went to his PCP today who referred him to the emergency department for concern for stroke.  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   Precautions  Precautions Fall  Restrictions  Weight Bearing Restrictions No  Home Living  Family/patient expects to be discharged to: Private residence  Living Arrangements Children;Spouse/significant other;Other relatives (Wife, son, granddaughter)  Available Help at Discharge Family;Available 24 hours/day  Type of Home Mobile home  Home Access Stairs to enter  Entrance Stairs-Number of Steps 5  Home Layout One level  Bathroom Higher education careers adviser (unsure; "it's very small")  Home Equipment None  Lives With Son;Spouse;Family  Prior Function  Level of Independence Independent  Comments does not drive  Communication  Communication Prefers language other than English;Interpreter utilized (Spanish)  Pain Assessment  Pain Assessment No/denies pain  Cognition  Arousal/Alertness Awake/alert   Behavior During Therapy WFL for tasks assessed/performed  Overall Cognitive Status Difficult to assess  General Comments son states "he's a little confused, he knows what he wants to say but sometimes says the wrong thing"  Difficult to assess due to Non-English speaking  Upper Extremity Assessment  Upper Extremity Assessment RUE deficits/detail  RUE Deficits / Details sensorimotor deficits affecting coordination and functional use RUE; isolated movements present; strength overall 4/5 hroughout; able to brush his teeth using R hand. Family states difficulty with manipulating utensils - does better with finger foods  RUE Sensation decreased light touch;decreased proprioception  RUE Coordination decreased fine motor  Lower Extremity Assessment  Lower Extremity Assessment Defer to PT evaluation  RLE Deficits / Details Noted minimal strength deficit on the RLE. With MMT strength is grossly 4/5 compared to L side which is 5/5. Pt reports decreased sensation up to R hip.   RLE Sensation decreased light touch  Cervical / Trunk Assessment  Cervical / Trunk Assessment Normal  ADL  Overall ADL's  Needs assistance/impaired  Eating/Feeding Minimal assistance  Eating/Feeding Details (indicate cue type and reason) difficulty manipulating utnesils; does better with finger foods  Grooming Set up;Supervision/safety;Standing  Upper Body Bathing Supervision/ safety;Set up;Sitting  Lower Body Bathing Min guard;Sit to/from stand  Upper Body Dressing  Set up;Sitting  Upper Body Dressing Details (indicate cue type and reason) pt able to snap and attempt to tie with difficulty  Toilet Transfer Min guard;Ambulation  Toileting- Architect and Hygiene Supervision/safety;Sit to/from stand  Functional mobility during ADLs Min guard  Vision- Assessment  Additional Comments pt states vision is not affected but then responds "  yes" when asked if his vision was blurry  Perception  Comments will further  assess; appears intact  Praxis  Praxis-Other Comments (appears intact)  Bed Mobility  Overal bed mobility Needs Assistance  Bed Mobility Supine to Sit  Supine to sit Supervision  Transfers  Overall transfer level Needs assistance  Equipment used None  Transfers Sit to/from Stand  Sit to Stand Min guard  Balance  Overall balance assessment Needs assistance  Sitting-balance support Feet supported;No upper extremity supported  Sitting balance-Leahy Scale Good  Standing balance support No upper extremity supported;During functional activity  Standing balance-Leahy Scale Fair  Standing balance comment able to sand and complete grooming at sink and reach out of BOS without difficulty  OT - End of Session  Equipment Utilized During Treatment Gait belt  Activity Tolerance Patient tolerated treatment well  Patient left in bed;Other (comment) (being taken for test)  Nurse Communication Mobility status  OT Assessment  OT Recommendation/Assessment Patient needs continued OT Services  OT Visit Diagnosis Unsteadiness on feet (R26.81);Muscle weakness (generalized) (M62.81);Other symptoms and signs involving cognitive function  OT Problem List Decreased strength;Decreased safety awareness;Decreased cognition;Decreased coordination;Decreased knowledge of use of DME or AE;Impaired sensation;Impaired UE functional use  OT Plan  OT Frequency (ACUTE ONLY) Min 3X/week  OT Treatment/Interventions (ACUTE ONLY) Self-care/ADL training;Therapeutic exercise;Neuromuscular education;DME and/or AE instruction;Therapeutic activities;Cognitive remediation/compensation;Patient/family education;Balance training  AM-PAC OT "6 Clicks" Daily Activity Outcome Measure  Help from another person eating meals? 3  Help from another person taking care of personal grooming? 3  Help from another person toileting, which includes using toliet, bedpan, or urinal? 3  Help from another person bathing (including washing, rinsing,  drying)? 3  Help from another person to put on and taking off regular upper body clothing? 3  Help from another person to put on and taking off regular lower body clothing? 3  6 Click Score 18  ADL G Code Conversion CK  OT Recommendation  Follow Up Recommendations Outpatient OT;Supervision/Assistance - 24 hour  OT Equipment Tub/shower seat  Individuals Consulted  Consulted and Agree with Results and Recommendations Patient;Family member/caregiver  Family Member Consulted son  Acute Rehab OT Goals  Patient Stated Goal Pt did not state goals  OT Goal Formulation With patient/family  Time For Goal Achievement 05/14/17  Potential to Achieve Goals Good  OT Time Calculation  OT Start Time (ACUTE ONLY) 1355  OT Stop Time (ACUTE ONLY) 1427  OT Time Calculation (min) 32 min  OT General Charges  $OT Visit 1 Visit  OT Evaluation  $OT Eval Moderate Complexity 1 Mod  Written Expression  Dominant Hand Right  [facility] Burnis Halling, OT/L  [phone] 04/30/2017

## 2017-04-30 NOTE — Evaluation (Signed)
Physical Therapy Evaluation Patient Details Name: Mario Sullivan MRN: [id number] DOB: 28-Aug-1945 Today's Date: 04/30/2017   History of Present Illness  Pt is a 71 y/o male with a PMH significant for COPD, DM, HTN.  He was in his normal state of health until yesterday when he went to bed.  He woke up around 3 AM, it was noted that he was slurring his speech.  This persisted and he went to his PCP today who referred him to the emergency department for concern for stroke.  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   Clinical Impression  Pt admitted with above diagnosis. Pt currently with functional limitations due to the deficits listed below (see PT Problem List). At the time of PT eval pt was able to perform transfers and ambulation with hands-on guarding/assist at all times. Pt with noted unsteadiness however no overt LOB was noted with basic ambulation. Pt will benefit from skilled PT to increase their independence and safety with mobility to allow discharge to the venue listed below.      Follow Up Recommendations Outpatient PT;Supervision/Assistance - 24 hour    Equipment Recommendations  None recommended by PT    Recommendations for Other Services       Precautions / Restrictions Precautions Precautions: Fall Restrictions Weight Bearing Restrictions: No      Mobility  Bed Mobility Overal bed mobility: Needs Assistance Bed Mobility: Supine to Sit     Supine to sit: Min guard     General bed mobility comments: Close guard as pt transitioned to EOB. Assist required to manage linen as pt's feet were tangled in sheets and he was unable to free them.   Transfers Overall transfer level: Needs assistance Equipment used: None Transfers: Sit to/from Stand Sit to Stand: Min guard         General transfer comment: Hands-on guarding required as pt powered-up to full standing position. Increased time  to gain/maintain standing balance.   Ambulation/Gait Ambulation/Gait assistance: Min guard Ambulation Distance (Feet): 200 Feet Assistive device: None Gait Pattern/deviations: Step-through pattern;Decreased stride length;Antalgic;Trunk flexed Gait velocity: Decreased Gait velocity interpretation: Below normal speed for age/gender General Gait Details: Noted gait was antalgic but no buckling of R knee or overt LOB noted. Pt with some increased sway and unsteadiness with head turn to the R and L.   Stairs            Wheelchair Mobility    Modified Rankin (Stroke Patients Only) Modified Rankin (Stroke Patients Only) Pre-Morbid Rankin Score: No symptoms Modified Rankin: Moderately severe disability     Balance Overall balance assessment: Needs assistance Sitting-balance support: Feet supported;No upper extremity supported Sitting balance-Leahy Scale: Fair     Standing balance support: No upper extremity supported;During functional activity Standing balance-Leahy Scale: Fair                               Pertinent Vitals/Pain Pain Assessment: No/denies pain    Home Living Family/patient expects to be discharged to:: Private residence Living Arrangements: Children;Spouse/significant other;Other relatives (Wife, son, granddaughter) Available Help at Discharge: Family;Available 24 hours/day Type of Home: Mobile home Home Access: Stairs to enter   Entrance Stairs-Number of Steps: 5 Home Layout: One level        Prior Function Level of Independence: Independent  Hand Dominance   Dominant Hand: Right    Extremity/Trunk Assessment   Upper Extremity Assessment Upper Extremity Assessment: Defer to OT evaluation    Lower Extremity Assessment Lower Extremity Assessment: RLE deficits/detail RLE Deficits / Details: Noted minimal strength deficit on the RLE. With MMT strength is grossly 4/5 compared to L side which is 5/5. Pt reports  decreased sensation up to R hip.  RLE Sensation: decreased light touch    Cervical / Trunk Assessment Cervical / Trunk Assessment: Other exceptions Cervical / Trunk Exceptions: Forward head/rounded shoulder posture  Communication   Communication: Prefers language other than English;Interpreter utilized (Spanish)  Cognition Arousal/Alertness: Awake/alert Behavior During Therapy: WFL for tasks assessed/performed Overall Cognitive Status: Difficult to assess                                        General Comments      Exercises     Assessment/Plan    PT Assessment Patient needs continued PT services  PT Problem List Decreased strength;Decreased range of motion;Decreased activity tolerance;Decreased balance;Decreased mobility;Decreased knowledge of use of DME;Decreased knowledge of precautions;Decreased safety awareness;Decreased cognition       PT Treatment Interventions DME instruction;Gait training;Stair training;Therapeutic activities;Therapeutic exercise;Neuromuscular re-education;Patient/family education    PT Goals (Current goals can be found in the Care Plan section)  Acute Rehab PT Goals Patient Stated Goal: Pt did not state goals PT Goal Formulation: With patient/family Time For Goal Achievement: 05/14/17 Potential to Achieve Goals: Good    Frequency Min 4X/week   Barriers to discharge        Co-evaluation PT/OT/SLP Co-Evaluation/Treatment: Yes Reason for Co-Treatment: Necessary to address cognition/behavior during functional activity;To address functional/ADL transfers PT goals addressed during session: Mobility/safety with mobility;Balance         AM-PAC PT "6 Clicks" Daily Activity  Outcome Measure Difficulty turning over in bed (including adjusting bedclothes, sheets and blankets)?: None Difficulty moving from lying on back to sitting on the side of the bed? : A Little Difficulty sitting down on and standing up from a chair with arms  (e.g., wheelchair, bedside commode, etc,.)?: A Little Help needed moving to and from a bed to chair (including a wheelchair)?: A Little Help needed walking in hospital room?: A Little Help needed climbing 3-5 steps with a railing? : A Little 6 Click Score: 19    End of Session Equipment Utilized During Treatment: Gait belt Activity Tolerance: Patient tolerated treatment well Patient left: in bed;with call bell/phone within reach;with family/visitor present;Other (comment) (Transport present to take pt to [place]) Nurse Communication: Mobility status PT Visit Diagnosis: Unsteadiness on feet (R26.81);Hemiplegia and hemiparesis Hemiplegia - Right/Left: Right Hemiplegia - dominant/non-dominant: Dominant Hemiplegia - caused by: Cerebral infarction    Time: 1355-1427 PT Time Calculation (min) (ACUTE ONLY): 32 min   Charges:   PT Evaluation $PT Eval Moderate Complexity: 1 Mod     PT G Codes:        Conni Slipper, PT, DPT Acute Rehabilitation Services Pager: [phone]   Marylynn Pearson 04/30/2017, 3:15 PM

## 2017-04-30 NOTE — Progress Notes (Signed)
STROKE TEAM PROGRESS NOTE   SUBJECTIVE (INTERVAL HISTORY) Patient has no family at bedside.   Overall he feels his condition is unchanged.  Voices no new complaints. No new events reported overnight.  Per translator Dr Ted Mcalpine  patient explained history of events that brought him into the hospital. He states he lives with his wife and daughter. States he was taking his medication consistently. Denies any history of smoking.  OBJECTIVE  Recent Labs Lab 04/29/17 1641 04/30/17 0116 04/30/17 0447 04/30/17 0745 04/30/17 1122  GLUCAP 188* 146* 227* 134* 141*    Recent Labs Lab 04/29/17 1632 04/29/17 1644 04/30/17 0712  NA 139 140 139  K 3.9 3.9 3.9  CL 102 102 104  CO2 26  --  26  GLUCOSE 201* 200* 139*  BUN 13 12 11   CREATININE 1.02 1.00 0.97  CALCIUM 8.8*  --  8.6*    Recent Labs Lab 04/29/17 1632  AST 25  ALT 25  ALKPHOS 36*  BILITOT 0.8  PROT 7.2  ALBUMIN 4.1    Recent Labs Lab 04/29/17 1632 04/29/17 1644  WBC 7.9  --   NEUTROABS 4.4  --   HGB 13.8 13.6  HCT 39.4 40.0  MCV 89.5  --   PLT 273  --     Recent Labs Lab 04/30/17 0116  TROPONINI <0.03    Recent Labs  04/29/17 1632  LABPROT 12.8  INR 0.97   No results for input(s): COLORURINE, LABSPEC, PHURINE, GLUCOSEU, HGBUR, BILIRUBINUR, KETONESUR, PROTEINUR, UROBILINOGEN, NITRITE, LEUKOCYTESUR in the last 72 hours.  Invalid input(s): APPERANCEUR     Component Value Date/Time   CHOL 146 04/30/2017 0351   TRIG 134 04/30/2017 0351   HDL 43 04/30/2017 0351   CHOLHDL 3.4 04/30/2017 0351   VLDL 27 04/30/2017 0351   LDLCALC 76 04/30/2017 0351   Lab Results  Component Value Date   HGBA1C 7.9 (H) 04/30/2017   No results found for: LABOPIA, COCAINSCRNUR, LABBENZ, AMPHETMU, THCU, LABBARB  No results for input(s): ETH in the last 168 hours.  IMAGING: I have personally reviewed the radiological images below and agree with the radiology interpretations.  Ct Head Wo Contrast Result  Date: 04/29/2017 IMPRESSION: Probable subacute infarct involving the basal ganglia at the level of the left lentiform nucleus and extending superiorly up towards the corona radiata. No evidence of associated hemorrhage.  Mr Brain Wo Contrast (neuro Protocol) Result Date: 04/29/2017 IMPRESSION: 1. Small acute/early subacute infarction involving left superior putamen and caudate nucleus. No acute hemorrhage. 2. Mild chronic microvascular ischemic changes and mild parenchymal volume loss of the brain.   Mr Maxine Glenn Head Wo Contrast Result Date: 04/30/2017 IMPRESSION: 1. Severe stenosis versus focally occluded LEFT M2 segments, possibly overestimated by tortuosity. 2. Occluded LEFT M3 segment.    2D Echo:                    PENDING  B/L Carotid Doppler: PENDING  PHYSICAL EXAM Temp:  [97.5 F (36.4 C)-98.9 F (37.2 C)] 98.3 F (36.8 C) (10/30 0845) Pulse Rate:  [58-74] 74 (10/30 0845) Resp:  [14-22] 16 (10/30 0845) BP: (127-154)/(63-86) 143/63 (10/30 0845) SpO2:  [97 %-100 %] 97 % (10/30 0845) Weight:  [67.1 kg (148 lb)-68.7 kg (151 lb 6.4 oz)] 68.7 kg (151 lb 6.4 oz) (10/29 2233)  General - Well nourished, well developed, in no apparent distress Respiratory - Unlabored breathing on exam Cardiovascular - Regular rate and rhythm  Mental Status -  Level of arousal and orientation  to time, place, and person were intact. Language including expression, naming, repetition, comprehension was assessed - some mild expressive aphasia with word finding difficulties and slow cognitive processing. Attention span and concentration were normal Recent and remote memory were intact Fund of Knowledge was assessed and was intact  Cranial Nerves II - XII - II - Visual field intact OU III, IV, VI - Extraocular movements intact. V - Facial sensation intact bilaterally. VII - Facial movement - slight Right facial droop VIII - Hearing & vestibular intact bilaterally X - Palate elevates symmetrically XI -  Chin turning & shoulder shrug intact bilaterally. XII - Tongue protrusion intact  Motor Strength - Right UE/LE was 4/5, Distal weaker than proximal strength. Weak right grip and decreased fine motor movements.  Bulk was normal and fasciculations were absent  Motor Tone - Muscle tone was assessed at the neck and appendages and was normal Sensory - Light touch was assessed and was symmetrical Coordination - The patient had normal movements in the hands and feet with no ataxia or dysmetria.  Tremor was absent Gait and Station - deferred.   ASSESSMENT AND PLAN: [name] is a 71 y.o. male with PMH of HTN, HLD, DM2   admitted for generalized weakness, slurred speech.   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   ECHO:                                 PENDING     B/L Carotid Doppler:         PENDING   LDL:  76  HgbA1c:  7.9   VTE prophylaxis:  Lovenox   Diet heart healthy/carb modified Room service appropriate? Yes; Fluid consistency: Thin    aspirin 81 mg daily prior to admission, now on clopidogrel 75 mg dailyand ASA 81 mg daily.        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 Some elevated B/P's noted. Trending down slowly Permissive hypertension (OK  if <220/120) for 24-48 hours post stroke and then gradually normalized within 5-7 days.  Long term BP goal normotensive. May slowly restart home B/P medications after 48 hours with close PCP follow up.  Hyperlipidemia:  Home meds:  Mevacor 10 mg  LDL    76 , goal < 70  Now on  Lipitor to 40 mg daily  Continue home statin at discharge - increase dose to Mevacor 20 mg daily  Other Stroke Risk Factors:  Obesity, Body mass index is 29.57 kg/m.   Hospital day # 1   Brita RompMary A Costello, ANP-C Stroke Neurology Team 04/30/2017 3:30 PM I have personally examined this patient, reviewed notes, independently viewed imaging studies, participated in medical decision making and plan of care.ROS completed by me personally and pertinent positives fully documented  I have made any additions or clarifications directly to the above note. Agree with note above.  He has presented with mild aphasia and right face and hand weakness secondary to large left subcortical infarct likely due to intracranial large vessel atherosclerosis. Recommend dual antiplatelet therapy for 3 months along with aggressive risk factor modification. Long discussion with the patient using Spanish language interpreter and answered questions. Discussed with Dr. Isidoro Donningai.I spent  35 minutes in total face-to-face time with the patient, more than 50% of which was spent in counseling and coordination of care, reviewing test results, reviewing medication and discussing or reviewing the  diagnosis of   intracranial stenosis, stroke discussing  the prognosis and treatment options.  Neurology will sign off at this time. Please call with any further questions or concerns. Follow-up in the stroke clinic in 6 weeks Thank you for this consultation.  Delia HeadyPramod Quron Ruddy, MD Medical Director [facility] Pager: [phone] 04/30/2017 4:13 PM    To contact Stroke Continuity provider, please refer to [email or web address]. After hours, contact General Neurology

## 2017-04-30 NOTE — Progress Notes (Signed)
OT Cancellation Note  Patient Details Name: Mario Sullivan MRN: [id number] DOB: 1946-06-14   Cancelled Treatment:    Reason Eval/Treat Not Completed: Other (comment). Pt currently on bedrest. Please update activity orders to initiate therapy evaluations. Thanks  Melville Belmont LLCWARD,HILLARY  Anthoni Geerts, OT/L  [phone] 04/30/2017 04/30/2017, 8:36 AM

## 2017-05-01 LAB — GLUCOSE, CAPILLARY
GLUCOSE-CAPILLARY: 142 mg/dL — AB (ref 65–99)
GLUCOSE-CAPILLARY: 219 mg/dL — AB (ref 65–99)
Glucose-Capillary: 135 mg/dL — ABNORMAL HIGH (ref 65–99)
Glucose-Capillary: 186 mg/dL — ABNORMAL HIGH (ref 65–99)

## 2017-05-01 LAB — BASIC METABOLIC PANEL
Anion gap: 7 (ref 5–15)
BUN: 14 mg/dL (ref 6–20)
CHLORIDE: 106 mmol/L (ref 101–111)
CO2: 25 mmol/L (ref 22–32)
CREATININE: 0.95 mg/dL (ref 0.61–1.24)
Calcium: 8.6 mg/dL — ABNORMAL LOW (ref 8.9–10.3)
Glucose, Bld: 139 mg/dL — ABNORMAL HIGH (ref 65–99)
POTASSIUM: 3.8 mmol/L (ref 3.5–5.1)
SODIUM: 138 mmol/L (ref 135–145)

## 2017-05-01 MED ORDER — LOVASTATIN 10 MG PO TABS: 20.0000 mg | ORAL_TABLET | Freq: Every day | ORAL | 0 refills | Status: AC

## 2017-05-01 MED ORDER — CLOPIDOGREL BISULFATE 75 MG PO TABS: 75.0000 mg | ORAL_TABLET | Freq: Every day | ORAL | 2 refills | Status: AC

## 2017-05-01 MED ORDER — ASPIRIN 325 MG PO TBEC: 325.0000 mg | DELAYED_RELEASE_TABLET | Freq: Every day | ORAL | 2 refills | Status: AC

## 2017-05-01 MED ORDER — CLOPIDOGREL BISULFATE 75 MG PO TABS: 75.0000 mg | ORAL_TABLET | Freq: Every day | ORAL | Status: DC

## 2017-05-01 NOTE — Progress Notes (Signed)
Pt d/c home, no new concerns, pt is stable. D/C instructions done with teach back using interpreter. Pt/family verbalize understanding. Pt is transported from hospital by family.

## 2017-05-01 NOTE — Progress Notes (Signed)
Yesterday CM discussed with son (who speaks [place]) about using one of the [employer] for PCP and [facility] pharmacy for medication assistance. The son was very interested.  CM was able to set him up with the [facility]. Appointment on the AVS. CM also provided the patient and family information on use of the [facility] pharmacy.  CM consulted for outpatient therapy. CM met with the patient and his daugher in law and using the interpretor service, inquired about rehab at [facility]. They were interested. Orders and information required sent to [facility]. Pt's daughter in law asking about forms left in room. They appear to be application for Medicaid. CM left message for financial counseling to please see patient again today about the forms in the room. Family to provide transportation home and support at home.

## 2017-05-01 NOTE — Discharge Summary (Signed)
Physician Discharge Summary  Mario Sullivan ZOX:[id number] DOB: 03-Jun-1946  PCP: Kurtis Bushman, PA  Admit date: 04/29/2017 Discharge date: 05/01/2017  Recommendations for Outpatient Follow-up:  1. [facility]/PCP On 05/20/17 at 1 PM. To be seen with repeat labs (CBC & BMP). 2. Dr. Delia Heady, Neurology in 6 weeks.  Home Health: Outpatient PT and OT. Equipment/Devices: None.    Discharge Condition: Improved and stable.  CODE STATUS: Full  Diet recommendation: Heart healthy & diabetic diet.  Discharge Diagnoses:  Principal Problem:   CVA (cerebral vascular accident) (HCC) Active Problems:   Essential hypertension   Diabetes mellitus, type 2 (HCC)   Hyperlipidemia   Brief Summary: 71 year old Spanish-speaking male, right-handed, PMH of poorly controlled type II DM, HTN, HLD, presented with generalized weakness and slurred speech. He was recently treated for acute bronchitis which had improved. On the morning of admission, patient was noted to have slurred speech and difficulty standing on his feet. He was admitted for acute stroke. Neurology was consulted.  Assessment and plan:  1. Acute left brain stroke: Left subcortical MCA stroke. Neurology was consulted. This was felt to be secondary to intracranial large vessel atherosclerosis. Resultant expressive aphasia, slight right facial droop and right-sided weakness. CT head: Probable subacute infarct involving the basal ganglia at the level of the left lentiform nucleus and extending superiorly up towards the corona radiata. MRI/MRA brain: Small acute/early subacute infarction involving the left superior putamen and caudate nucleus. Occluded left M2 and M3 segments. 2-D echo: LVEF 65-70 percent. No AST or patent foramina ovale identified. Carotid Dopplers: Right and left ICA: 1-39 percent stenosis. LDL 76. A1c 7.9. Patient was on aspirin 81 MG daily prior to admission. As per neurology recommendations, dual antiplatelet  agents for 3 months and then Plavix alone based on SAMMPRIS trial. I discussed with Dr. Pearlean Brownie to verify and he recommends aspirin 325 MG daily + Plavix 75 MG daily 3 months then Plavix alone. Therapies recommend outpatient PT and OT, arranged by case management. Outpatient follow-up with neurology in 6 weeks.  2. Intracranial stenosis: Occluded left M2 and M3 segments: Dual antiplatelet therapy as indicated above. 3. Uncontrolled DM 2: A1c 7.9, goal <7. Continue metformin at discharge.? Compliance. Counseled regarding importance of compliance with all aspects of medical care. Consider adjusting medications, may be adding sulfonylurea during outpatient follow-up. 4. Essential hypertension: Antihypertensives were temporarily held to allow permissive hypertension. Resume lisinopril at discharge. Mildly orthostatic with PT this am. Patient and family were counseled regarding gradual maneuvers to manage the associated dizziness. 5. Hyperlipidemia: LDL 76, goal <70. As per neurology recommendation, increased Mevacor from 10 mg daily to 20 mg daily at discharge.    Consultations:  Neurology   Procedures:  As above  Discharge Instructions  Discharge Instructions    Ambulatory referral to Neurology    Complete by:  As directed    An appointment is requested in approximately: 6 weeks Follow up with stroke clinic (Dr Pearlean Brownie preferred, if not available, then consider Darrol Angel, Dr Sylvie Farrier, [facility] or Lucia Gaskins whoever is available) at [facility] in about 6-8 weeks. Thanks.   Ambulatory referral to Occupational Therapy    Complete by:  As directed    Ambulatory referral to Physical Therapy    Complete by:  As directed    Call MD for:    Complete by:  As directed    Strokelike symptoms.   Diet - low sodium heart healthy    Complete by:  As directed  Diet Carb Modified    Complete by:  As directed    Increase activity slowly    Complete by:  As directed        Medication List    STOP taking  these medications   aspirin 81 MG tablet Replaced by:  aspirin 325 MG EC tablet   predniSONE 20 MG tablet Commonly known as:  DELTASONE     TAKE these medications   aspirin 325 MG EC tablet Take 1 tablet (325 mg total) by mouth daily. Replaces:  aspirin 81 MG tablet   clopidogrel 75 MG tablet Commonly known as:  PLAVIX Take 1 tablet (75 mg total) by mouth daily.   lisinopril 10 MG tablet Commonly known as:  PRINIVIL,ZESTRIL Take 10 mg by mouth daily.   lovastatin 10 MG tablet Commonly known as:  MEVACOR Take 2 tablets (20 mg total) by mouth daily. What changed:  how much to take   metFORMIN 1000 MG tablet Commonly known as:  GLUCOPHAGE Take 1,000 mg by mouth 2 (two) times daily with a meal.   PROAIR HFA 108 (90 Base) MCG/ACT inhaler Generic drug:  albuterol Inhale 2 puffs into the lungs 2 (two) times daily.      Follow-up Information    Micki Riley, MD. Schedule an appointment as soon as possible for a visit in 6 week(s).   Specialties:  Neurology, Radiology Contact information: [address] [place] [address] [phone]        [facility] Follow up on 05/20/2017.   Why:  your appointment time is 1 pm. Please arrive 15 min early and bring a picture ID and current medications. To be seen with repeat labs (CBC & BMP). Contact information:  [address] [place], [place] [address]  [phone]         Allergies  Allergen Reactions  . Penicillins     Has patient had a PCN reaction causing immediate rash, facial/tongue/throat swelling, SOB or lightheadedness with hypotension: Unknown Has patient had a PCN reaction causing severe rash involving mucus membranes or skin necrosis: Unknown Has patient had a PCN reaction that required hospitalization: Unknown Has patient had a PCN reaction occurring within the last 10 years: Unknown If all of the above answers are "NO", then may proceed with Cephalosporin use.        Procedures/Studies: Ct Head Wo Contrast  Result Date: 04/29/2017 CLINICAL DATA:  Slurred speech, left-sided weakness and fatigue. EXAM: CT HEAD WITHOUT CONTRAST TECHNIQUE: Contiguous axial images were obtained from the base of the skull through the vertex without intravenous contrast. COMPARISON:  None. FINDINGS: Brain: There is evidence of a probable subacute deep infarct involving the superior aspect of the left lentiform nucleus and extending up towards the corona radiata. No associated hemorrhage or visible mass-effect. No hydrocephalus. No extra-axial fluid collections. Vascular: No hyperdense vessel or unexpected calcification. Skull: Normal. Negative for fracture or focal lesion. Sinuses/Orbits: No acute finding. Other: None. IMPRESSION: Probable subacute infarct involving the basal ganglia at the level of the left lentiform nucleus and extending superiorly up towards the corona radiata. No evidence of associated hemorrhage. Electronically Signed   By: Irish Lack M.D.   On: 04/29/2017 17:06   Mr Brain Wo Contrast (neuro Protocol)  Result Date: 04/29/2017 CLINICAL DATA:  71 y/o  M; stroke-like symptoms. EXAM: MRI HEAD WITHOUT CONTRAST TECHNIQUE: Multiplanar, multiecho pulse sequences of the brain and surrounding structures were obtained without intravenous contrast. COMPARISON:  04/29/2017 CT of  the head. FINDINGS: Brain: Area of reduced diffusion involving the left superior putamen, caudate head, and caudate body measuring 3.4 x 1.2 x 2.1 cm (volume = 4.5 cm^3) (AP x ML x CC series 3, image 34 and series 5, image 18) compatible with acute/early subacute infarction. The infarct demonstrates associated T2 FLAIR hyperintense signal abnormality. No abnormal susceptibility to indicate intracranial hemorrhage. Few nonspecific foci of T2 FLAIR hyperintense signal abnormality in subcortical and periventricular white matter are compatible with mild chronic microvascular ischemic changes for  age. Mild brain parenchymal volume loss. Small chronic infarct within the right lateral cerebellar hemisphere. No hydrocephalus, extra-axial collection, or effacement of basilar cisterns. Vascular: Normal flow voids. Skull and upper cervical spine: Normal marrow signal. Sinuses/Orbits: Negative. Other: None. IMPRESSION: 1. Small acute/early subacute infarction involving left superior putamen and caudate nucleus. No acute hemorrhage. 2. Mild chronic microvascular ischemic changes and mild parenchymal volume loss of the brain. These results were called by telephone at the time of interpretation on 04/29/2017 at 7:54 pm to Dr. Lorre NickANTHONY ALLEN , who verbally acknowledged these results. Electronically Signed   By: Mitzi HansenLance  Furusawa-Stratton M.D.   On: 04/29/2017 19:56   Mr Maxine GlennMra Head Wo Contrast  Result Date: 04/30/2017 CLINICAL DATA:  Follow-up stroke. EXAM: MRA HEAD WITHOUT CONTRAST TECHNIQUE: Angiographic images of the Circle of Willis were obtained using MRA technique without intravenous contrast. COMPARISON:  MRI of the head April 29, 2017 FINDINGS: ANTERIOR CIRCULATION: Normal flow related enhancement of the included cervical, petrous, cavernous and supraclinoid internal carotid arteries. Patent anterior communicating artery. Poor flow related artifact LEFT M2 inferior division though, normal caliber. Focal loss of LEFT M2 superior division flow related enhancement, patent distally. Occluded LEFT M3 segment within anterior sylvian fissure. Patent anterior cerebral artery's. No aneurysm. POSTERIOR CIRCULATION: RIGHT vertebral artery is dominant. Basilar artery is patent, with normal flow related enhancement of the main branch vessels. Patent posterior cerebral arteries. LEFT posterior communicating artery infundibulum. No large vessel occlusion, flow limiting stenosis,  aneurysm. ANATOMIC VARIANTS: None. Source images and MIP images were reviewed. IMPRESSION: 1. Severe stenosis versus focally occluded LEFT M2  segments, possibly overestimated by tortuosity. 2. Occluded LEFT M3 segment. Electronically Signed   By: Awilda Metroourtnay  Bloomer M.D.   On: 04/30/2017 02:02      Subjective: Patient was interviewed and examined using the Spanish interpreter at bedside with patient's daughter and PT in the room. Patient reported feeling slightly dizzy at times in upright position. He denied any other complaints. He reported improving strength in his right extremities.  Discharge Exam:  Vitals:   05/01/17 0700 05/01/17 0958 05/01/17 1300 05/01/17 1440  BP: 134/68 134/74 128/74 (!) 142/59  Pulse: 64  76 70  Resp: 18   18  Temp: 97.7 F (36.5 C)  98.2 F (36.8 C) 97.9 F (36.6 C)  TempSrc: Oral  Oral Oral  SpO2: 98%  97% 97%  Weight:      Height:        General: Pleasant elderly male, moderately built and nourished, sitting up comfortably at edge of bed. Cardiovascular: S1 & S2 heard, RRR, S1/S2 +. No murmurs, rubs, gallops or clicks. No JVD or pedal edema. Telemetry: SB in the 50s-SR. Respiratory: Clear to auscultation without wheezing, rhonchi or crackles. No increased work of breathing. Abdominal:  Non distended, non tender & soft. No organomegaly or masses appreciated. Normal bowel sounds heard. CNS: Alert and oriented. No obvious cranial nerve deficits noted at this time. Extremities: no edema, no cyanosis. Right  extremities with grade 4 x 5 power. Left extremity grade 5 x 5 power.    The results of significant diagnostics from this hospitalization (including imaging, microbiology, ancillary and laboratory) are listed below for reference.      Labs: CBC:  Recent Labs Lab 04/29/17 1632 04/29/17 1644  WBC 7.9  --   NEUTROABS 4.4  --   HGB 13.8 13.6  HCT 39.4 40.0  MCV 89.5  --   PLT 273  --    Basic Metabolic Panel:  Recent Labs Lab 04/29/17 1632 04/29/17 1644 04/30/17 0712 05/01/17 0716  NA 139 140 139 138  K 3.9 3.9 3.9 3.8  CL 102 102 104 106  CO2 26  --  26 25  GLUCOSE  201* 200* 139* 139*  BUN 13 12 11 14   CREATININE 1.02 1.00 0.97 0.95  CALCIUM 8.8*  --  8.6* 8.6*   Liver Function Tests:  Recent Labs Lab 04/29/17 1632  AST 25  ALT 25  ALKPHOS 36*  BILITOT 0.8  PROT 7.2  ALBUMIN 4.1   Cardiac Enzymes:  Recent Labs Lab 04/30/17 0116  TROPONINI <0.03   CBG:  Recent Labs Lab 04/30/17 1623 04/30/17 2101 05/01/17 0050 05/01/17 0642 05/01/17 1132  GLUCAP 145* 176* 142* 135* 186*   Hgb A1c  Recent Labs  04/30/17 0116  HGBA1C 7.9*   Lipid Profile  Recent Labs  04/30/17 0351  CHOL 146  HDL 43  LDLCALC 76  TRIG 134  CHOLHDL 3.4   Discussed in detail with patient's daughter at bedside. Updated care and answered questions.   Time coordinating discharge: Over 30 minutes  SIGNED:  Marcellus Scott, MD, FACP, FHM. [facility] Pager [phone]  If 7PM-7AM, please contact night-coverage [email or web address] Password [email or web address] 05/01/2017, 3:48 PM

## 2017-05-01 NOTE — Progress Notes (Signed)
Physical Therapy Treatment Patient Details Name: Mario Sullivan MRN: [id number] DOB: [birth year] Today's Date: 05/01/2017    History of Present Illness Pt is a 71 y/o male with a PMH significant for COPD, DM, HTN.  He was in his normal state of health until yesterday when he went to bed.  He woke up around 3 AM, it was noted that he was slurring his speech.  This persisted and he went to his PCP today who referred him to the emergency department for concern for stroke.  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     PT Comments    Pt progressing towards physical therapy goals. Appears to be improving with gross strength and coordination of the R side. Focus of session was gait and stair training, and pt able to complete with min guard to supervision for safety. Daughter present during stair training for education as well. Pt reports dizziness sitting EOB and mild vertigo upon standing. BP in sitting: 152/79, BP in standing: 138/66. Will continue to follow and progress as able per POC.    Follow Up Recommendations  Outpatient PT;Supervision/Assistance - 24 hour     Equipment Recommendations  None recommended by PT    Recommendations for Other Services       Precautions / Restrictions Precautions Precautions: Fall Restrictions Weight Bearing Restrictions: No    Mobility  Bed Mobility Overal bed mobility: Modified Independent Bed Mobility: Supine to Sit           General bed mobility comments: No assist to transition to EOB. Pt initially reported no dizziness upon sitting EOB however few minutes later reported dizziness when MD asked.  Transfers Overall transfer level: Needs assistance Equipment used: None Transfers: Sit to/from [employer] Sit to Stand: Supervision Stand pivot transfers: Supervision       General transfer comment: Supervision for safety as pt powered-up to full  standing position. No unsteadiness noted.   Ambulation/Gait Ambulation/Gait assistance: Min guard;Supervision Ambulation Distance (Feet): 200 Feet Assistive device: None Gait Pattern/deviations: Step-through pattern;Decreased stride length;Antalgic;Trunk flexed Gait velocity: Decreased Gait velocity interpretation: Below normal speed for age/gender General Gait Details: Initially min guard provided for safety however progressed to supervision for safety by end of gait training.    Stairs Stairs: Yes   Stair Management: One rail Left;Step to pattern;Forwards Number of Stairs: 5 (x2) General stair comments: VC's for sequencing and general safety. Practiced x2 with frequent cues. Daughter present for stair training.   Wheelchair Mobility    Modified Rankin (Stroke Patients Only) Modified Rankin (Stroke Patients Only) Pre-Morbid Rankin Score: No symptoms Modified Rankin: Moderately severe disability     Balance Overall balance assessment: Needs assistance Sitting-balance support: Feet supported;No upper extremity supported Sitting balance-Leahy Scale: Good     Standing balance support: No upper extremity supported;During functional activity Standing balance-Leahy Scale: Fair                              Cognition Arousal/Alertness: Awake/alert Behavior During Therapy: WFL for tasks assessed/performed Overall Cognitive Status: Impaired/Different from baseline Area of Impairment: Attention;Awareness                   Current Attention Level: Selective       Awareness: Emergent   General Comments: Appears to be improving      Exercises Other Exercises Other Exercises: theraputty level 2  strengthening - handout Other Exercises: fine motor/coordination Other Exercises: gross motor/coordination activitie - balloon toss/dribbling ball Other Exercises: BUE integrated activities - cup stacking with and without sequencing numbers/letters Other  Exercises: eye hand coordination activities - "simulated dynavision activities"    General Comments        Pertinent Vitals/Pain Pain Assessment: Faces Faces Pain Scale: Hurts a little bit Pain Location: R UE with movement Pain Descriptors / Indicators: Discomfort Pain Intervention(s): Monitored during session    Home Living                      Prior Function            PT Goals (current goals can now be found in the care plan section) Acute Rehab PT Goals Patient Stated Goal: to get better PT Goal Formulation: With patient/family Time For Goal Achievement: 05/14/17 Potential to Achieve Goals: Good Progress towards PT goals: Progressing toward goals    Frequency    Min 4X/week      PT Plan Current plan remains appropriate    Co-evaluation              AM-PAC PT "6 Clicks" Daily Activity  Outcome Measure  Difficulty turning over in bed (including adjusting bedclothes, sheets and blankets)?: None Difficulty moving from lying on back to sitting on the side of the bed? : A Little Difficulty sitting down on and standing up from a chair with arms (e.g., wheelchair, bedside commode, etc,.)?: A Little Help needed moving to and from a bed to chair (including a wheelchair)?: A Little Help needed walking in hospital room?: A Little Help needed climbing 3-5 steps with a railing? : A Little 6 Click Score: 19    End of Session Equipment Utilized During Treatment: Gait belt Activity Tolerance: Patient tolerated treatment well Patient left: with family/visitor present (With interpreter and OT) Nurse Communication: Mobility status PT Visit Diagnosis: Unsteadiness on feet (R26.81);Hemiplegia and hemiparesis Hemiplegia - Right/Left: Right Hemiplegia - dominant/non-dominant: Dominant Hemiplegia - caused by: Cerebral infarction     Time: 0830-0912 PT Time Calculation (min) (ACUTE ONLY): 42 min  Charges:  $Gait Training: 38-52 mins                    G  Codes:       Conni SlipperLaura Lionardo Haze, PT, DPT Acute Rehabilitation Services Pager: [phone]    Marylynn PearsonLaura D Brown Dunlap 05/01/2017, 12:08 PM

## 2017-05-01 NOTE — Progress Notes (Signed)
Occupational Therapy Treatment Patient Details Name: Mario Sullivan MRN: [id number] DOB: 12/02/1945 Today's Date: 05/01/2017    History of present illness Pt is a 71 y/o male with a PMH significant for COPD, DM, HTN.  He was in his normal state of health until yesterday when he went to bed.  He woke up around 3 AM, it was noted that he was slurring his speech.  This persisted and he went to his PCP today who referred him to the emergency department for concern for stroke.  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    OT comments  Pt seen with interpreter [name]([place]) to complete education on safe tub transfers with use of shower seat, home safety and need for S, and established and HEP for fine motor and coordination activities to improve functional use of RUE. Pt did not complain of dizziness during session. BP 135/74 at end of session. Recommend pt follow up with outpt OT at a neuo outpt center - discussed with family. Pt safe to DC home with S when medically stable. Will continue to follow acutely.   Follow Up Recommendations  Outpatient OT    Equipment Recommendations  Tub/shower seat    Recommendations for Other Services      Precautions / Restrictions Precautions Precautions: Fall       Mobility Bed Mobility Overal bed mobility: Modified Independent                Transfers Overall transfer level: Needs assistance   Transfers: Sit to/from Stand;Stand Pivot Transfers Sit to Stand: Supervision Stand pivot transfers: Supervision            Balance Overall balance assessment: Needs assistance   Sitting balance-Leahy Scale: Good       Standing balance-Leahy Scale: Fair                             ADL either performed or assessed with clinical judgement   ADL     Eating/Feeding Details (indicate cue type and reason): issued red tubing to use with utensils - pt able  to return demonstrate                                 Functional mobility during ADLs: Supervision/safety General ADL Comments: overall set up for ADL. Minguard for tub transfser. Completed tub transfer with pt and daughter. Educated on recommendation for using showerchair and to sit while bathing to reduce risk of falls. Pt/daughter verbalized understanding.  Discussed home safety, including avoid ing use of power tools, lawn mowers and only cooking with S. Pt enjoys cooking therefore educated family on starting with activities which do not involve the stove and slowly introducing activities involving the stove, cutting, etc as pt improves. Pt/daughter verbalized understanding.     Vision  Pt appears to be scanning environment and did not observe pt misusing space on R side     Perception     Praxis      Cognition Arousal/Alertness: Awake/alert Behavior During Therapy: WFL for tasks assessed/performed Overall Cognitive Status: Impaired/Different from baseline Area of Impairment: Attention;Awareness                   Current Attention Level: Selective       Awareness: Emergent   General Comments: Slow processing; Appears  to be improving        Exercises Exercises: Other exercises Other Exercises Other Exercises: theraputty level 2 strengthening - handout Other Exercises: fine motor/coordination Other Exercises: gross motor/coordination activitie - balloon toss/dribbling ball Other Exercises: BUE integrated activities - cup stacking with and without sequencing numbers/letters Other Exercises: eye hand coordination activities - "simulated dynavision activities"   Shoulder Instructions       General Comments      Pertinent Vitals/ Pain       Pain Assessment: No/denies pain  Home Living                                          Prior Functioning/Environment              Frequency  Min 3X/week        Progress Toward  Goals  OT Goals(current goals can now be found in the care plan section)  Progress towards OT goals: Progressing toward goals  Acute Rehab OT Goals Patient Stated Goal: to get better OT Goal Formulation: With patient/family Time For Goal Achievement: 05/14/17 Potential to Achieve Goals: Good ADL Goals Pt Will Perform Lower Body Bathing: with modified independence;sit to/from stand Pt Will Perform Lower Body Dressing: with modified independence;sit to/from stand Pt Will Transfer to Toilet: with modified independence;ambulating;regular height toilet Pt Will Perform Toileting - Clothing Manipulation and hygiene: with modified independence;sit to/from stand Pt Will Perform Tub/Shower Transfer: Tub transfer;ambulating;with caregiver independent in assisting;shower seat Pt/caregiver will Perform Home Exercise Program: Right Upper extremity;With written HEP provided;With Supervision  Plan Discharge plan remains appropriate    Co-evaluation                 AM-PAC PT "6 Clicks" Daily Activity     Outcome Measure   Help from another person eating meals?: None Help from another person taking care of personal grooming?: None Help from another person toileting, which includes using toliet, bedpan, or urinal?: A Little Help from another person bathing (including washing, rinsing, drying)?: A Little Help from another person to put on and taking off regular upper body clothing?: None Help from another person to put on and taking off regular lower body clothing?: A Little 6 Click Score: 21    End of Session Equipment Utilized During Treatment: Gait belt  OT Visit Diagnosis: Unsteadiness on feet (R26.81);Muscle weakness (generalized) (M62.81);Other symptoms and signs involving cognitive function   Activity Tolerance Patient tolerated treatment well   Patient Left in bed;with call bell/phone within reach;with family/visitor present   Nurse Communication Mobility status        Time:  1610-9604 OT Time Calculation (min): 35 min  Charges: OT General Charges $OT Visit: 1 Visit OT Treatments $Self Care/Home Management : 8-22 mins $Therapeutic Activity: 8-22 mins  [facility], OT/L  [phone] 05/01/2017   Mario Sullivan,Mario Sullivan 05/01/2017, 9:52 AM

## 2017-05-09 ENCOUNTER — Other Ambulatory Visit: Payer: Self-pay | Admitting: *Deleted

## 2017-05-09 NOTE — Patient Outreach (Signed)
[facility]) Care Management  05/09/2017  Mario Sullivan [birth year] [id number]  Referral via EMMI-Stroke-Red Alert-day #6, 05/08/2017 Reason: Feeling worse overall-Yes  Telephone call to patient; call was answered by patient's son who voices that he had automated calls directed to his phone because patient does not speak [place]. Son states he answered yes to question but did not understand question. States he is currently at work & directed this care coordinator to call his sister Mario Sullivan [phone]) who is taking care of patient at his home.   Telephone call to patient's daughter/Mario Sullivan who gave HIPPA verification. States she is caregiver & speaks for her father who does not speak [place]. [place] help her mother care for patient. States patient was admitted to hospital because of slurred speech & weakness. States he had stroke but is home now & speech has cleared up. States he is doing better and getting stronger. States he is walking without assistance. States appetite is not good but he knows he needs to eat. States he is drinking & eating small amounts frequently.   Daughter/caregiver states she is aware of stroke symptoms & knows to call 911 if patient has symptoms.  States she is managing patient's medications & makes sure he is taking as directed by MD instructions.   Daughter/caregiver did not have discharge instructions because she arrived after patient was home. States she does not have information about appointments that patient needs to attend . States her brother has instructions. Caregiver was advised of appointment information based on discharge information in chart. She had questions regarding whether patient could eat because blood work was to be done. Patient was given phone number to call office to have her questions answered. She will call office to get information. States she will not be able to talk much longer but agrees to take  calls once weekly to learn more of information about her father's appointments for follow up.  Unable to complete screening assessment due caregiver could not talk any longer.   States she wanted automated calls stopped and preferred to take calls from RN care coordinator.  Plan: Open EMMI-Stroke case for 30 days.  Have automated calls discontinued per caregiver request. Follow up with patient/caregiver within 7 business days.  Colleen CanLinda Arless Vineyard, RN BSN CCM Care Management Coordinator [facility]  [phone]

## 2017-05-16 ENCOUNTER — Other Ambulatory Visit: Payer: Self-pay | Admitting: *Deleted

## 2017-05-16 NOTE — Patient Outreach (Signed)
[facility]) Care Management  05/16/2017  Mario Sullivan [birth year] [id number]  EMMI-Stroke call#2;   Subjective: Spoke with patient's daughter/caregiver -Izora Gala who speaks for him with his permission had recent stroke and has trouble with speech. Also does not speak [place].  Since last contact daughter/caregiver voices that patient has attended appointment at clinic in [facility] Bettey Mare). States he received prescription for Jardiance 57m daily. Caregiver states prescription was filled at clinic. She voices that she manages patient's medication & makes sure he is taking as prescribed by his doctors.   States [facility] & appointment with stroke specialist 06/19/2017. [facility]. States Medicaid is pending & he has appointment next week regarding trying to get insurance.  States patient's  son is providing transportation for patient to get to appointments and she always goes with him to appointments.   States patient is walking in home without use of walker & has not   had any falls. States she and other family members watch him closely. States he has loss of appetite but understands importance of eating to regain his strength. Currently eating small frequent meals throughout the day.    Voices that he does have some trouble talking but no trouble swallowing.  States she is aware of stroke symptoms  & will call 911 if he has any of them.  States she has reviewed symptoms of stroke in educational information with discharge instructions.   Objective:  Discharge Diagnoses:  Principal Problem:   CVA (cerebral vascular accident) ([place] Active Problems:   Essential hypertension   Diabetes mellitus, type 2 ([place]   Hyperlipidemia  Current Medications:  Current Outpatient Medications  Medication Sig Dispense Refill  .  albuterol (PROAIR HFA) 108 (90 Base) MCG/ACT inhaler Inhale 2 puffs into the lungs 2 (two) times daily.    .Marland Kitchenaspirin EC 325 MG EC tablet Take 1 tablet (325 mg total) by mouth daily. 30 tablet 2  . clopidogrel (PLAVIX) 75 MG tablet Take 1 tablet (75 mg total) by mouth daily. 30 tablet 2  . empagliflozin (JARDIANCE) 10 MG TABS tablet Take 10 mg by mouth.    .Marland Kitchenlisinopril (PRINIVIL,ZESTRIL) 10 MG tablet Take 10 mg by mouth daily.    .Marland Kitchenlovastatin (MEVACOR) 10 MG tablet Take 2 tablets (20 mg total) by mouth daily. 60 tablet 0  . metFORMIN (GLUCOPHAGE) 1000 MG tablet Take 1,000 mg by mouth 2 (two) times daily with a meal.     No current facility-administered medications for this visit.     Functional Status:  In your present state of health, do you have any difficulty performing the following activities: 05/16/2017 04/29/2017  Hearing? N N  Vision? N N  Difficulty concentrating or making decisions? N N  Comment - speech is slow and hard to understand what he says  Walking or climbing stairs? Y Y  Dressing or bathing? N Y  Doing errands, shopping? Y Y    Fall/Depression Screening: Fall Risk  05/16/2017  Falls in the past year? No   No flowsheet data found.  Assessment:  Patient has strong family support. Daughter/Mario Sullivan is primary caregiver.  Patient has some speech difficulty according to daughter/caregiver. Patient with loss of appetite but eating frequent small meals.  Caregiver is managing medications & makes sure patient takes medications as ordered per MD.  Patient currently has no insurance. Medicaid is  pending per daughter.   Plan:  Follow 30 days per EMMI stroke program for care coordination &  disease management.   [facility] CM Care Plan Problem One     Most Recent Value  Care Plan Problem One  At risk for hospital readmission within 30 days as evidenced by recent admission for Stroke  Role Documenting the Problem One  Care Management Telephonic Coordinator  Care Plan for Problem  One  Active  [facility] Long Term Goal   patient will not have readmission within 30 days of  previous admission   [facility] Long Term Goal Start Date  05/09/17  Interventions for Problem One Long Term Goal  Explain/teach importance of  attendance to MD appts, med adherence , early reporting of abnormal symptoms [re-enforce med adherence, MD f/u, reporting sxs to MD]  [facility] CM Short Term Goal #1   caregiver will report that she has been in contact with MD office regarding hospital follow up appointment within 7 days regarding appt for patient.  [facility] CM Short Term Goal #1 Start Date  05/09/17  [facility] CM Short Term Goal #1 Met Date  05/16/17  Interventions for Short Term Goal #1  Advised of f/u appt information as noted in d/c instructions, advise caregiver to get written d/c instructions from brother to  review. [re-enforce of following d/c instructions, attending MD appts]  [facility] CM Short Term Goal #2   Caregiver will report that patient has attended follow up appt within 14 days   [facility] CM Short Term Goal #2 Start Date  05/09/17  [facility] CM Short Term Goal #2 Met Date  05/16/17  Interventions for Short Term Goal #2  Advise of importance of attendance to hospital follow up visit      Will follow up.  Appointment set per agreement with caregiver/daughter.  Sherrin Daisy, RN BSN [place] Management Coordinator [facility] Care Management  [phone]

## 2017-05-17 ENCOUNTER — Encounter: Payer: Self-pay | Admitting: *Deleted

## 2017-05-20 ENCOUNTER — Ambulatory Visit (INDEPENDENT_AMBULATORY_CARE_PROVIDER_SITE_OTHER): Payer: Self-pay | Admitting: Family Medicine

## 2017-05-20 VITALS — BP 124/61 | HR 82 | Temp 98.3°F | Resp 16 | Ht 63.0 in | Wt 147.0 lb

## 2017-05-20 DIAGNOSIS — I10 Essential (primary) hypertension: Secondary | ICD-10-CM

## 2017-05-21 NOTE — Progress Notes (Signed)
Patient has a primary care provider on file. Did not establish on today.    Mario NationsLaChina Sullivan Mario Alvira  MSN, FNP-C Patient Care [facility] [address]  [place], [place] [id number] [phone]

## 2017-05-22 ENCOUNTER — Other Ambulatory Visit: Payer: Self-pay | Admitting: *Deleted

## 2017-05-22 NOTE — Patient Outreach (Signed)
[facility]) Care Management  05/22/2017  Mario Sullivan 01-08-1946 [id number]  Telephone call to patient/caregiver; not available.   Plan: Will follow up.  Colleen CanLinda Maridee Slape, RN BSN CCM Care Management Coordinator [facility] Care Management  [phone]

## 2017-05-29 ENCOUNTER — Ambulatory Visit: Payer: Self-pay | Admitting: *Deleted

## 2017-05-31 ENCOUNTER — Other Ambulatory Visit: Payer: Self-pay | Admitting: *Deleted

## 2017-05-31 NOTE — Patient Outreach (Signed)
[facility]) Care Management  05/31/2017  Mario Sullivan [birth year] [id number]   EMMI-Stroke referral:  Follow up call attempt x 2; unable to leave message.  Plan: Will follow up next week.   Colleen CanLinda Nathaly Dawkins, RN BSN CCM Care Management Coordinator [facility] Care Management  [phone]

## 2017-06-03 ENCOUNTER — Ambulatory Visit: Payer: Self-pay | Admitting: *Deleted

## 2017-06-05 ENCOUNTER — Other Ambulatory Visit: Payer: Self-pay | Admitting: *Deleted

## 2017-06-05 NOTE — Patient Outreach (Signed)
[facility]) Care Management  06/05/2017  Mario Sullivan [birth year] [id number]  Telephone call to caregiver/daughter. Advised that caregiver not home; better to call back tomorrow.  [place]: Will follow up.  Mario [name], RN BSN CCM Care Management Coordinator [facility] Care Management  [phone]

## 2017-06-06 ENCOUNTER — Other Ambulatory Visit: Payer: Self-pay | Admitting: *Deleted

## 2017-06-19 ENCOUNTER — Encounter: Payer: Self-pay | Admitting: Neurology

## 2017-06-19 ENCOUNTER — Ambulatory Visit: Payer: Self-pay | Admitting: Neurology

## 2017-06-19 VITALS — BP 108/68 | HR 66 | Ht 64.0 in | Wt 146.4 lb

## 2017-06-19 DIAGNOSIS — I6381 Other cerebral infarction due to occlusion or stenosis of small artery: Secondary | ICD-10-CM

## 2017-06-19 NOTE — Patient Instructions (Addendum)
I had a long d/w patient , daughter using spanish language interpreter about his recent lacunar stroke, risk for recurrent stroke/TIAs, personally independently reviewed imaging studies and stroke evaluation results and answered questions.Continue  aspirin and Plavix for 2 more months then discontinue Plavix and stay on aspirin alone for secondary stroke prevention and maintain strict control of hypertension with blood pressure goal below 130/90, diabetes with hemoglobin A1c goal below 6.5% and lipids with LDL cholesterol goal below 70 mg/dL. I also advised the patient to eat a healthy diet with plenty of whole grains, cereals, fruits and vegetables, exercise regularly and maintain ideal body weight Followup in the future with my nurse practitioner Shanda Bumps in 6 months or call earlier if necessary . Stroke Prevention Some medical conditions and behaviors are associated with a higher chance of having a stroke. You can help prevent a stroke by making nutrition, lifestyle, and other changes, including managing any medical conditions you may have. What nutrition changes can be made?  Eat healthy foods. You can do this by: ? Choosing foods high in fiber, such as fresh fruits and vegetables and whole grains. ? Eating at least 5 or more servings of fruits and vegetables a day. Try to fill half of your plate at each meal with fruits and vegetables. ? Choosing lean protein foods, such as lean cuts of meat, poultry without skin, fish, tofu, beans, and nuts. ? Eating low-fat dairy products. ? Avoiding foods that are high in salt (sodium). This can help lower blood pressure. ? Avoiding foods that have saturated fat, trans fat, and cholesterol. This can help prevent high cholesterol. ? Avoiding processed and premade foods.  Follow your health care provider's specific guidelines for losing weight, controlling high blood pressure (hypertension), lowering high cholesterol, and managing diabetes. These may  include: ? Reducing your daily calorie intake. ? Limiting your daily sodium intake to 1,500 milligrams (mg). ? Using only healthy fats for cooking, such as olive oil, canola oil, or sunflower oil. ? Counting your daily carbohydrate intake. What lifestyle changes can be made?  Maintain a healthy weight. Talk to your health care provider about your ideal weight.  Get at least 30 minutes of moderate physical activity at least 5 days a week. Moderate activity includes brisk walking, biking, and swimming.  Do not use any products that contain nicotine or tobacco, such as cigarettes and e-cigarettes. If you need help quitting, ask your health care provider. It may also be helpful to avoid exposure to secondhand smoke.  Limit alcohol intake to no more than 1 drink a day for nonpregnant women and 2 drinks a day for men. One drink equals 12 oz of beer, 5 oz of wine, or 1 oz of hard liquor.  Stop any illegal drug use.  Avoid taking birth control pills. Talk to your health care provider about the risks of taking birth control pills if: ? You are over 43 years old. ? You smoke. ? You get migraines. ? You have ever had a blood clot. What other changes can be made?  Manage your cholesterol levels. ? Eating a healthy diet is important for preventing high cholesterol. If cholesterol cannot be managed through diet alone, you may also need to take medicines. ? Take any prescribed medicines to control your cholesterol as told by your health care provider.  Manage your diabetes. ? Eating a healthy diet and exercising regularly are important parts of managing your blood sugar. If your blood sugar cannot be managed through diet and  exercise, you may need to take medicines. ? Take any prescribed medicines to control your diabetes as told by your health care provider.  Control your hypertension. ? To reduce your risk of stroke, try to keep your blood pressure below 130/80. ? Eating a healthy diet and  exercising regularly are an important part of controlling your blood pressure. If your blood pressure cannot be managed through diet and exercise, you may need to take medicines. ? Take any prescribed medicines to control hypertension as told by your health care provider. ? Ask your health care provider if you should monitor your blood pressure at home. ? Have your blood pressure checked every year, even if your blood pressure is normal. Blood pressure increases with age and some medical conditions.  Get evaluated for sleep disorders (sleep apnea). Talk to your health care provider about getting a sleep evaluation if you snore a lot or have excessive sleepiness.  Take over-the-counter and prescription medicines only as told by your health care provider. Aspirin or blood thinners (antiplatelets or anticoagulants) may be recommended to reduce your risk of forming blood clots that can lead to stroke.  Make sure that any other medical conditions you have, such as atrial fibrillation or atherosclerosis, are managed. What are the warning signs of a stroke? The warning signs of a stroke can be easily remembered as BEFAST.  B is for balance. Signs include: ? Dizziness. ? Loss of balance or coordination. ? Sudden trouble walking.  E is for eyes. Signs include: ? A sudden change in vision. ? Trouble seeing.  F is for face. Signs include: ? Sudden weakness or numbness of the face. ? The face or eyelid drooping to one side.  A is for arms. Signs include: ? Sudden weakness or numbness of the arm, usually on one side of the body.  S is for speech. Signs include: ? Trouble speaking (aphasia). ? Trouble understanding.  T is for time. ? These symptoms may represent a serious problem that is an emergency. Do not wait to see if the symptoms will go away. Get medical help right away. Call your local emergency services (911 in the [place] Do not drive yourself to the hospital.  Other signs of stroke  may include: ? A sudden, severe headache with no known cause. ? Nausea or vomiting. ? Seizure.  Where to find more information: For more information, visit:  American Stroke Association: www.strokeassociation.org  National Stroke Association: www.stroke.org  Summary  You can prevent a stroke by eating healthy, exercising, not smoking, limiting alcohol intake, and managing any medical conditions you may have.  Do not use any products that contain nicotine or tobacco, such as cigarettes and e-cigarettes. If you need help quitting, ask your health care provider. It may also be helpful to avoid exposure to secondhand smoke.  Remember BEFAST for warning signs of stroke. Get help right away if you or a loved one has any of these signs. This information is not intended to replace advice given to you by your health care provider. Make sure you discuss any questions you have with your health care provider. Document Released: 07/26/2004 Document Revised: 07/24/2016 Document Reviewed: 07/24/2016 Elsevier Interactive Patient Education  2018 [employer].  Plan de alimentacin DASH DASH Eating Plan DASH es la sigla en ingls de "Enfoques Alimentarios para Detener la Hipertensin" (Dietary Approaches to Stop Hypertension). El plan de alimentacin DASH ha demostrado bajar la presin arterial elevada (hipertensin). Tambin puede reducir [employer] de diabetes tipo 2,  enfermedad cardaca y accidente cerebrovascular. Este plan tambin puede ayudar a Geophysical data processoradelgazar. Consejos para seguir este plan [place]autas generales  Evite ingerir ms de 2,300 mg (miligramos) de sal (sodio) por da. Si tiene hipertensin, es posible que necesite reducir la ingesta de sodio a 1,500 mg por da.  Limite el consumo de alcohol a no ms de 1medida por da si es mujer y no est [place]embarazada, y 2medidas por da si es hombre. Una medida equivale a 12oz (355ml) de cerveza, 5oz (148ml) de vino o 1oz (44ml) de bebidas alcohlicas de  alta graduacin.  Trabaje con su mdico para mantener un peso saludable o perder The PNC Financialpeso. Pregntele cul es el peso recomendado para usted.  Realice al menos 30 minutos de ejercicio que haga que se acelere su corazn (ejercicio Magazine features editoraerbico) la DIRECTVmayora de los das de la Travelers Restsemana. Estas actividades pueden incluir caminar, nadar o andar en bicicleta.  Trabaje con su mdico o especialista en alimentacin y nutricin (nutricionista) para ajustar su plan alimentario a sus necesidades calricas personales. Lectura de las etiquetas de los alimentos  Verifique en las etiquetas de los alimentos, la cantidad de sodio por porcin. Elija alimentos con menos del 5 por ciento del valor diario de sodio. Generalmente, los alimentos con menos de 300 mg de sodio por porcin se encuadran dentro de este plan alimentario.  Para encontrar cereales integrales, busque la palabra "integral" como primera palabra en la lista de ingredientes. De compras  Compre productos en los que en su etiqueta diga: "bajo contenido de sodio" o "sin agregado de sal".  Compre alimentos frescos. Evite los alimentos enlatados y comidas precocidas o congeladas. Coccin  Evite agregar sal cuando cocine. Use hierbas o aderezos sin sal, en lugar de sal de mesa o sal marina. Consulte al mdico o farmacutico antes de usar sustitutos de la sal.  No fra los alimentos. A la hora de cocinar los alimentos opte por hornearlos, hervirlos, grillarlos y asarlos a Patent attorneyla parrilla.  Cocine con aceites cardiosaludables, como oliva, canola, soja o girasol. Planificacin de las comidas   Consuma una dieta equilibrada, que incluya lo siguiente: ? 5o ms porciones de frutas y Warehouse managerverduras por da. Trate de que la mitad del plato de cada comida sean frutas y verduras. ? Hasta 6 u 8 porciones de cereales integrales por da. ? Menos de 6 onzas de carne, aves o pescado Copymagros por da. Una porcin de 3 onzas de carne tiene casi el mismo tamao que un mazo de cartas. Un  huevo equivale a 1 onza. ? Dos porciones de productos lcteos descremados por Futures traderda. ? Una porcin de frutos secos, semillas o frijoles 5 veces por semana. ? Grasas cardiosaludables. Las grasas saludables llamadas cidos grasos omega-3 se encuentran en alimentos como semillas de lino y pescados de agua fra, como por ejemplo, sardinas, salmn y caballa.  Limite la cantidad que ingiere de los siguientes alimentos: ? Alimentos enlatados o envasados. ? Alimentos con alto contenido de grasa trans, como alimentos fritos. ? Alimentos con alto contenido de grasa saturada, como carne con grasa. ? Dulces, postres, bebidas azucaradas y otros alimentos con azcar agregada. ? Productos lcteos enteros.  No le agregue sal a los alimentos antes de probarlos.  Trate de comer al menos 2 comidas vegetarianas por semana.  Consuma ms comida casera y menos de restaurante, de bufs y comida rpida.  Cuando coma en un restaurante, pida que preparen su comida con menos sal o, en lo posible, sin nada de sal. Qu alimentos se recomiendan?  Los alimentos enumerados a continuacin no constituyen Water quality scientistuna lista completa. Hable con el nutricionista sobre las mejores opciones alimenticias para usted. Cereales Pan de salvado o integral. Pasta de salvado o integral. Arroz integral. Avena. Quinua. Trigo burgol. Cereales integrales y con bajo contenido de [place]-Novel[place] Hillsodio. Pan pita. Galletitas de Franceagua con bajo contenido de [place]grasa y Emmonssodio. Tortillas de Keny[place]arina integral. Verduras Verduras frescas o congeladas (crudas, al vapor, asadas o grilladas). Jugos de tomate y verduras con bajo contenido de sodio o reducidos en sodio. Salsa y pasta de tomate con bajo contenido de sodio o reducidas en sodio. Verduras enlatadas con bajo contenido de sodio o reducidas en sodio. Frutas Todas las frutas frescas, congeladas o disecadas. Frutas enlatadas en jugo natural (sin agregado de azcar). Carne y otros alimentos proteicos Pollo o pavo sin piel. Carne de  pollo o de Eppingpavo molida. Cerdo desgrasado. Pescado y Liberty Globalmariscos. Claras de huevo. Porotos, guisantes o lentejas secos. Frutos secos, mantequilla de frutos secos y semillas sin sal. Frijoles enlatados sin sal. Cortes de carne vacuna magra, desgrasada. Embutidos magros, con bajo contenido de Violasodio. Lcteos Leche descremada (1%) o descremada. Quesos sin grasa, con bajo contenido de grasa o descremados. Queso blanco o ricota sin grasa, con bajo contenido de Arlingtonsodio. Yogur semidescremado o descremado. Queso con bajo contenido de [place]grasa y Villa Verdesodio. Grasas y Hershey Companyaceites Margarinas untables que no contengan grasas trans. Aceite vegetal. Jerolyn ShinMayonesa y aderezos para ensaladas livianos o con bajo contenido de grasas (reducidos en sodio). Aceite de canola, crtamo, oliva, soja y Mysticgirasol. Aguacate. Condimentos y otros alimentos Hierbas. Especias. Mezclas de condimentos sin sal. Palomitas de maz y pretzels sin sal. Dulces con bajo contenido de grasas. Qu alimentos no se recomiendan? Los alimentos enumerados a continuacin no constituyen Water quality scientistuna lista completa. Hable con el nutricionista sobre las mejores opciones alimenticias para usted. Cereales Productos de panificacin hechos con grasa, como medialunas, magdalenas y algunos panes. Comidas con arroz o pasta seca listas para usar. Verduras Verduras con crema o fritas. Verduras en salsa de Littlefieldqueso. Verduras enlatadas regulares (que no sean con bajo contenido de sodio o reducidas en sodio). Pasta y salsa de tomates enlatadas regulares (que no sean con bajo contenido de sodio o reducidas en sodio). Jugos de tomate y verduras regulares (que no sean con bajo contenido de sodio o reducidos en sodio). Pepinillos. Aceitunas. Nils PyleFrutas Fruta enlatada en almbar liviano o espeso. Frutas cocidas en aceite. Frutas con salsa de crema o Maxeysmanteca. Carne y otros alimentos proteicos Cortes de carne con grasa. Costillas. Carne frita. Tocino. Salchichas. Mortadela y otras carnes procesadas. Salame.  Panceta. Perros calientes (hotdogs). Salchicha de cerdo. Frutos secos y semillas con sal. Frijoles enlatados con agregado de sal. Pescado enlatado o ahumado. Huevos enteros o yemas. Pollo o pavo con piel. Lcteos Leche entera o al 2%, crema y [id number]mitad leche y mitad crema. Queso crema entero o con toda su grasa. Yogur entero o endulzado. Quesos con toda su grasa. Sustitutos de cremas no lcteas. Coberturas batidas. Quesos para untar y quesos procesados. Grasas y Barnes & Nobleaceites Mantequilla. Margarina en barra. Manteca de cerdo. Materia grasa. Mantequilla clarificada. Grasa de panceta. Aceites tropicales como aceite de coco, palmiste o palma. Condimentos y otros alimentos Palomitas de maz y pretzels con sal. Sal de cebolla, sal de ajo, sal condimentada, sal de mesa y sal marina. Salsa Worcestershire. Salsa trtara. Salsa barbacoa. Salsa teriyaki. Salsa de soja, incluso la que tiene contenido reducido de Norfolksodio. Salsa de carne. Salsas en lata y envasadas. Salsa de pescado. Salsa de Walthallostras. Salsa  rosada. Rbano picante envasado. [place]. Mostaza. Saborizantes y tiernizantes para carne. Caldo en cubitos. Salsa picante y salsa tabasco. Escabeches envasados o ya preparados. Aderezos para tacos prefabricados o envasados. Salsas. Aderezos comunes para ensalada. Dnde encontrar ms informacin:  [employer] del [address], los Pulmones y Risk manager (National Heart, Lung, and Blood Institute): PopSteam.is  Asociacin Estadounidense del Corazn (American Heart Association): www.heart.org Resumen  El plan de alimentacin DASH ha demostrado bajar la presin arterial elevada (hipertensin). Tambin puede reducir [employer] International de diabetes tipo 2, enfermedad cardaca y accidente cerebrovascular.  Con el plan de alimentacin DASH, deber limitar el consumo de sal (sodio) a 2,300 mg por da. Si tiene hipertensin, es posible que necesite reducir la ingesta de sodio a 1,500 mg por da.  Cuando siga el plan de alimentacin  DASH, trate de comer ms frutas frescas y verduras, cereales integrales, carnes magras, lcteos descremados y grasas cardiosaludables.  Trabaje con su mdico o especialista en alimentacin y nutricin (nutricionista) para ajustar su plan alimentario a sus necesidades calricas personales. Esta informacin no tiene Theme park manager el consejo del mdico. Asegrese de hacerle al mdico cualquier pregunta que tenga. Document Released: 06/07/2011 Document Revised: 10/08/2016 Document Reviewed: 10/08/2016 Elsevier Interactive Patient Education  [employer].

## 2017-06-19 NOTE — Progress Notes (Signed)
[facility] [address] [place]. [place] [address] [phone]       OFFICE FOLLOW-UP NOTE  [name] Date of Birth:  [birth year] Medical Record Number:  [id number]   HPI: Mr Mario HarpsHernandez is a [age] year [place] male seen today for first office follow-up visit following hospital admission for stroke in October 2018. History is obtained from the patient, his daughter and Spanish language interpreter was present throughout this visit. I have personally reviewed electronic medical records and imaging films.Mario Sullivan is a 71 y.o. male who has been treated for bronchitis recently.  He was in his normal state of health until yesterday when he went to bed.  He woke up around 3 AM, it was noted that he was slurring his speech.  This persisted and he went to his PCP today who referred him to the emergency department for concern for stroke.  An MRI was performed which does show a deep stroke on the left. LKW: 04/29/17 prior to bed tpa given?: no, out of window. MRI scan of the brain showed a left basal ganglia lacunar infarct. Hemoglobin A1c was elevated at 7.9. LDL cholesterol was 76 g percent. 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 Carotid ultrasound and transthoracic echo unremarkable. Patient had mild expressive aphasia with right facial droop and right hand weakness. He showed gradual improvement. We'll discharge him underline dictator therapy for his intracranial atherosclerosis. Is doing well. Is finished outpatient physical occupational speech therapy. He still occasionally has some word finding difficulties and word hesitation. He has regained almost full strength in his right hand and face. He is tolerating aspirin and Plavix without bruising or bleeding. Strict blood pressure is under good control today it is 108/58. He is tolerating his cholesterol medication without muscle aches and pains. The patient plans to visit [place]  over the holidays season.    ROS:   14 system review of systems is positive for  constipation, diarrhea, nausea, vomiting, insomnia, speech difficulty, muscle cramps, decreased concentration, anxiety and nervousness and all other systems negative  PMH:  Past Medical History:  Diagnosis Date  . COPD (chronic obstructive pulmonary disease) (HCC)   . Diabetes mellitus type 2 in nonobese (HCC)   . Hyperlipidemia   . Hypertension   . Stroke [facility](HCC)     Social History:  Social History   Socioeconomic History  . Marital status: Married    Spouse name: Not on file  . Number of children: Not on file  . Years of education: Not on file  . Highest education level: Not on file  Social Needs  . Financial resource strain: Not on file  . Food insecurity - worry: Not on file  . Food insecurity - inability: Not on file  . Transportation needs - medical: Not on file  . Transportation needs - non-medical: Not on file  Occupational History  . Occupation: retired  Tobacco Use  . Smoking status: Former Smoker    Years: 30.00    Last attempt to quit: 1980    Years since quitting: 38.9  . Smokeless tobacco: Never Used  Substance and Sexual Activity  . Alcohol use: No    Frequency: Never    Comment: rare; h/o heavy use  . Drug use: No  . Sexual activity: Not on file  Other Topics Concern  . Not on file  Social History Narrative  . Not on file    Medications:   Current  Outpatient Medications on File Prior to Visit  Medication Sig Dispense Refill  . aspirin EC 325 MG EC tablet Take 1 tablet (325 mg total) by mouth daily. 30 tablet 2  . budesonide-formoterol (SYMBICORT) 160-4.5 MCG/ACT inhaler Inhale 2 puffs into the lungs 2 (two) times daily.    . clopidogrel (PLAVIX) 75 MG tablet Take 1 tablet (75 mg total) by mouth daily. 30 tablet 2  . empagliflozin (JARDIANCE) 10 MG TABS tablet Take 10 mg by mouth.    . escitalopram (LEXAPRO) 10 MG tablet Take 10 mg by mouth at bedtime.    Marland Kitchen  lisinopril (PRINIVIL,ZESTRIL) 10 MG tablet Take 10 mg by mouth daily.    Marland Kitchen lovastatin (MEVACOR) 10 MG tablet Take 2 tablets (20 mg total) by mouth daily. 60 tablet 0  . Melatonin 5 MG TABS Take by mouth at bedtime.    . metFORMIN (GLUCOPHAGE) 1000 MG tablet Take 1,000 mg by mouth 2 (two) times daily with a meal.    . Multiple Vitamin (MULTI VITAMIN MENS PO) Take by mouth.    . ondansetron (ZOFRAN) 4 MG tablet Take 4 mg by mouth every 8 (eight) hours as needed for nausea or vomiting.     No current facility-administered medications on file prior to visit.     Allergies:   Allergies  Allergen Reactions  . Penicillins     Has patient had a PCN reaction causing immediate rash, facial/tongue/throat swelling, SOB or lightheadedness with hypotension: Unknown Has patient had a PCN reaction causing severe rash involving mucus membranes or skin necrosis: Unknown Has patient had a PCN reaction that required hospitalization: Unknown Has patient had a PCN reaction occurring within the last 10 years: Unknown If all of the above answers are "NO", then may proceed with Cephalosporin use.     Physical Exam General: well developed, well nourished, middle-aged Hispanic male seated, in no evident distress Head: head normocephalic and atraumatic.  Neck: supple with no carotid or supraclavicular bruits Cardiovascular: regular rate and rhythm, no murmurs Musculoskeletal: no deformity Skin:  no rash/petichiae Vascular:  Normal pulses all extremities Vitals:   06/19/17 1626  BP: 108/68  Pulse: 66   Neurologic Exam: a Spanish language interpreter was used Mental Status: Awake and fully alert. Oriented to place and time. Recent and remote memory intact. Attention span, concentration and fund of knowledge appropriate. Mood and affect appropriate.  Cranial Nerves: Fundoscopic exam reveals sharp disc margins. Pupils equal, briskly reactive to light. Extraocular movements full without nystagmus. Visual fields  full to confrontation. Hearing intact. Facial sensation intact. Face, tongue, palate moves normally and symmetrically.  Motor: Normal bulk and tone. Normal strength in all tested extremity muscles. Mild diminished fine finger movements on the right. Orbits left over right upper extremity. Sensory.: intact to touch ,pinprick .position and vibratory sensation.  Coordination: Rapid alternating movements normal in all extremities. Finger-to-nose and heel-to-shin performed accurately bilaterally. Gait and Station: Arises from chair without difficulty. Stance is normal. Gait demonstrates normal stride length and balance . Able to heel, toe and tandem walk without difficulty.  Reflexes: 1+ and symmetric. Toes downgoing.   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 He is doing quite well with minimum residual deficits. Multiple vascular risk factors of diabetes, hypertension, hyperlipidemia   PLAN: I had a long d/w patient , daughter using spanish language interpreter about his recent subcortical  stroke, risk for recurrent  stroke/TIAs, personally independently reviewed imaging studies and stroke evaluation results and answered questions.Continue  aspirin and Plavix for 2 more months then discontinue Plavix and stay on aspirin alone for secondary stroke prevention and maintain strict control of hypertension with blood pressure goal below 130/90, diabetes with hemoglobin A1c goal below 6.5% and lipids with LDL cholesterol goal below 70 mg/dL. I also advised the patient to eat a healthy diet with plenty of whole grains, cereals, fruits and vegetables, exercise regularly and maintain ideal body weight Followup in the future with my nurse practitioner Shanda BumpsJessica in 6 months or call earlier if necessary . Greater than 50% of time during this 25 minute visit was spent on counseling,explanation of  diagnosis, planning of further management, discussion with patient and family and coordination of care  Note: This document was prepared with digital dictation and possible smart phrase technology. Any transcriptional errors that result from this process are unintentional

## 2017-07-05 NOTE — Patient Outreach (Signed)
[facility] Care Management  07/05/2017  Mario Sullivan [birth year] [id number]  Late entry for 06/06/2017-10:07 am EMMI-Stroke case closure.  Telephone call to caregiver/daughter who advised that patient was currently receiving outpatient speech therapy, occupational therapy & physical therapy at [facility]. States he is showing improvement. Voices no readmissions to hospital or emergency room visits since hospital stay.  Voices that she continues to manage patient's medications & makes sure he is taking as directed.   Voices no concerns at this time. Knows when to call 911. Has appointment with neurologist 12/19.    EMMI-program completed. Goals were meet. Caregiver agrees with case closure.  Plan: Update care plan. Send to care management assistant for closure.  Colleen CanLinda Aijah Lattner, RN BSN CCM Care Management Coordinator [facility] Care Management  [phone]

## 2018-01-14 ENCOUNTER — Telehealth: Payer: Self-pay | Admitting: *Deleted

## 2018-01-14 ENCOUNTER — Ambulatory Visit: Payer: Medicare Other | Admitting: Adult Health

## 2018-01-14 NOTE — Telephone Encounter (Signed)
Pt no showed their office visit today.

## 2018-01-14 NOTE — Progress Notes (Deleted)
[facility] [address] [place]. [place] [address] [phone]       OFFICE FOLLOW-UP NOTE  [name] Date of Birth:  09-Jul-1945 Medical Record Number:  [id number]   HPI: Mr Mario Sullivan is a [age] year [place] male seen today for first office follow-up visit following hospital admission for stroke in October 2018. History is obtained from the patient, his daughter and Spanish language interpreter was present throughout this visit. I have personally reviewed electronic medical records and imaging films.Mario Dronendres Rison is a [age] y.o. male who has been treated for bronchitis recently.  He was in his normal state of health until yesterday when he went to bed.  He woke up around 3 AM, it was noted that he was slurring his speech.  This persisted and he went to his PCP today who referred him to the emergency department for concern for stroke.  An MRI was performed which does show a deep stroke on the left. LKW: 04/29/17 prior to bed tpa given?: no, out of window. MRI scan of the brain showed a left basal ganglia lacunar infarct. Hemoglobin A1c was elevated at 7.9. LDL cholesterol was 76 g percent. 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 Carotid ultrasound and transthoracic echo unremarkable. Patient had mild expressive aphasia with right facial droop and right hand weakness. He showed gradual improvement. We'll discharge him underline dictator therapy for his intracranial atherosclerosis. Is doing well. Is finished outpatient physical occupational speech therapy. He still occasionally has some word finding difficulties and word hesitation. He has regained almost full strength in his right hand and face. He is tolerating aspirin and Plavix without bruising or bleeding. Strict blood pressure is under good control today it is 108/58. He is tolerating his cholesterol medication without muscle aches and pains. The patient plans to visit [place]  over the holidays season.    ROS:   14 system review of systems is positive for  constipation, diarrhea, nausea, vomiting, insomnia, speech difficulty, muscle cramps, decreased concentration, anxiety and nervousness and all other systems negative  PMH:  Past Medical History:  Diagnosis Date  . COPD (chronic obstructive pulmonary disease) (HCC)   . Diabetes mellitus type 2 in nonobese (HCC)   . Hyperlipidemia   . Hypertension   . Stroke [facility](HCC)     Social History:  Social History   Socioeconomic History  . Marital status: Married    Spouse name: Not on file  . Number of children: Not on file  . Years of education: Not on file  . Highest education level: Not on file  Occupational History  . Occupation: retired  Engineer, productionocial Needs  . Financial resource strain: Not on file  . Food insecurity:    Worry: Not on file    Inability: Not on file  . Transportation needs:    Medical: Not on file    Non-medical: Not on file  Tobacco Use  . Smoking status: Former Smoker    Years: 30.00    Last attempt to quit: 1980    Years since quitting: 39.5  . Smokeless tobacco: Never Used  Substance and Sexual Activity  . Alcohol use: No    Frequency: Never    Comment: rare; h/o heavy use  . Drug use: No  . Sexual activity: Not on file  Lifestyle  . Physical activity:    Days per week: Not on file    Minutes per session: Not on file  .  Stress: Not on file  Relationships  . Social connections:    Talks on phone: Not on file    Gets together: Not on file    Attends religious service: Not on file    Active member of club or organization: Not on file    Attends meetings of clubs or organizations: Not on file    Relationship status: Not on file  . Intimate partner violence:    Fear of current or ex partner: Not on file    Emotionally abused: Not on file    Physically abused: Not on file    Forced sexual activity: Not on file  Other Topics Concern  . Not on file  Social History Narrative    . Not on file    Medications:   Current Outpatient Medications on File Prior to Visit  Medication Sig Dispense Refill  . aspirin EC 325 MG EC tablet Take 1 tablet (325 mg total) by mouth daily. 30 tablet 2  . budesonide-formoterol (SYMBICORT) 160-4.5 MCG/ACT inhaler Inhale 2 puffs into the lungs 2 (two) times daily.    . clopidogrel (PLAVIX) 75 MG tablet Take 1 tablet (75 mg total) by mouth daily. 30 tablet 2  . empagliflozin (JARDIANCE) 10 MG TABS tablet Take 10 mg by mouth.    . escitalopram (LEXAPRO) 10 MG tablet Take 10 mg by mouth at bedtime.    Marland Kitchen lisinopril (PRINIVIL,ZESTRIL) 10 MG tablet Take 10 mg by mouth daily.    Marland Kitchen lovastatin (MEVACOR) 10 MG tablet Take 2 tablets (20 mg total) by mouth daily. 60 tablet 0  . Melatonin 5 MG TABS Take by mouth at bedtime.    . metFORMIN (GLUCOPHAGE) 1000 MG tablet Take 1,000 mg by mouth 2 (two) times daily with a meal.    . Multiple Vitamin (MULTI VITAMIN MENS PO) Take by mouth.    . ondansetron (ZOFRAN) 4 MG tablet Take 4 mg by mouth every 8 (eight) hours as needed for nausea or vomiting.     No current facility-administered medications on file prior to visit.     Allergies:   Allergies  Allergen Reactions  . Penicillins     Has patient had a PCN reaction causing immediate rash, facial/tongue/throat swelling, SOB or lightheadedness with hypotension: Unknown Has patient had a PCN reaction causing severe rash involving mucus membranes or skin necrosis: Unknown Has patient had a PCN reaction that required hospitalization: Unknown Has patient had a PCN reaction occurring within the last 10 years: Unknown If all of the above answers are "NO", then may proceed with Cephalosporin use.     Physical Exam General: well developed, well nourished, middle-aged Hispanic male seated, in no evident distress Head: head normocephalic and atraumatic.  Neck: supple with no carotid or supraclavicular bruits Cardiovascular: regular rate and rhythm, no  murmurs Musculoskeletal: no deformity Skin:  no rash/petichiae Vascular:  Normal pulses all extremities There were no vitals filed for this visit. Neurologic Exam: a Spanish language interpreter was used Mental Status: Awake and fully alert. Oriented to place and time. Recent and remote memory intact. Attention span, concentration and fund of knowledge appropriate. Mood and affect appropriate.  Cranial Nerves: Fundoscopic exam reveals sharp disc margins. Pupils equal, briskly reactive to light. Extraocular movements full without nystagmus. Visual fields full to confrontation. Hearing intact. Facial sensation intact. Face, tongue, palate moves normally and symmetrically.  Motor: Normal bulk and tone. Normal strength in all tested extremity muscles. Mild diminished fine finger movements on the right. Orbits left  over right upper extremity. Sensory.: intact to touch ,pinprick .position and vibratory sensation.  Coordination: Rapid alternating movements normal in all extremities. Finger-to-nose and heel-to-shin performed accurately bilaterally. Gait and Station: Arises from chair without difficulty. Stance is normal. Gait demonstrates normal stride length and balance . Able to heel, toe and tandem walk without difficulty.  Reflexes: 1+ and symmetric. Toes downgoing.   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 He is doing quite well with minimum residual deficits. Multiple vascular risk factors of diabetes, hypertension, hyperlipidemia   PLAN: I had a long d/w patient , daughter using spanish language interpreter about his recent subcortical  stroke, risk for recurrent stroke/TIAs, personally independently reviewed imaging studies and stroke evaluation results and answered questions.Continue  aspirin and Plavix for 2 more months then discontinue Plavix and stay on aspirin alone  for secondary stroke prevention and maintain strict control of hypertension with blood pressure goal below 130/90, diabetes with hemoglobin A1c goal below 6.5% and lipids with LDL cholesterol goal below 70 mg/dL. I also advised the patient to eat a healthy diet with plenty of whole grains, cereals, fruits and vegetables, exercise regularly and maintain ideal body weight Followup in the future with my nurse practitioner Shanda Bumps in 6 months or call earlier if necessary .  Greater than 50% of time during this 25 minute visit was spent on counseling,explanation of diagnosis, planning of further management, discussion with patient and family and coordination of care  George Hugh, [facility]  [facility] [address] [place], [place] [address]  Phone [phone] Fax [phone] Note: This document was prepared with digital dictation and possible smart phrase technology. Any transcriptional errors that result from this process are unintentional.

## 2018-01-15 ENCOUNTER — Encounter: Payer: Self-pay | Admitting: Adult Health

## 2018-02-26 IMAGING — MR MR MRA HEAD W/O CM
1 series · 19 of 48 positions shown · non-contrast
Comparison: MRI of the head April 29, 2017

CLINICAL DATA: Follow-up stroke.

EXAM:
MRA HEAD WITHOUT CONTRAST
TECHNIQUE: Angiographic images of the Circle of Willis were obtained using MRA
technique without intravenous contrast.

[Series 3: ax (id) 2 · axial · 1.0mm · 0.43mm/px · z∈[-75,+11]mm · 19 of 188 slices shown]
[im 1/188]
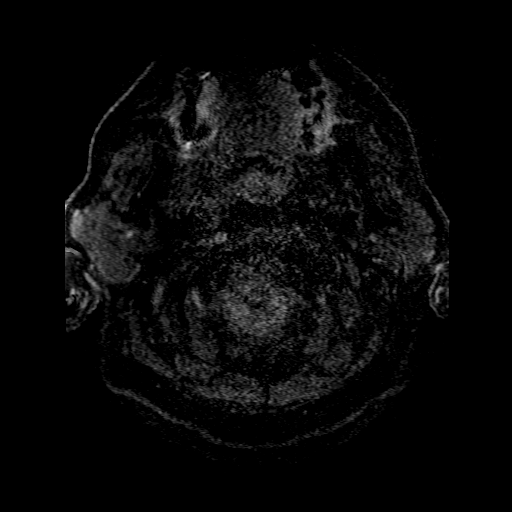
[im 4/188]
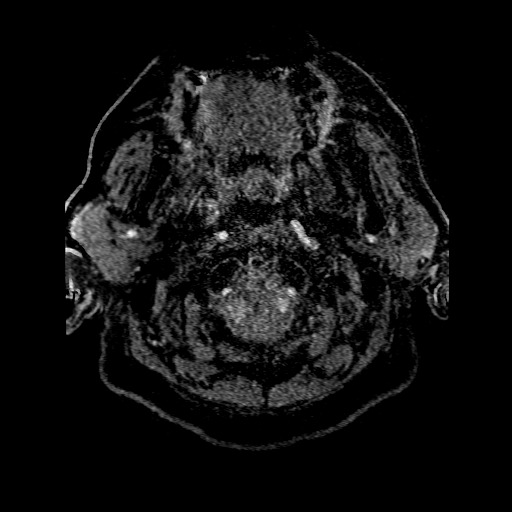
[im 8/188]
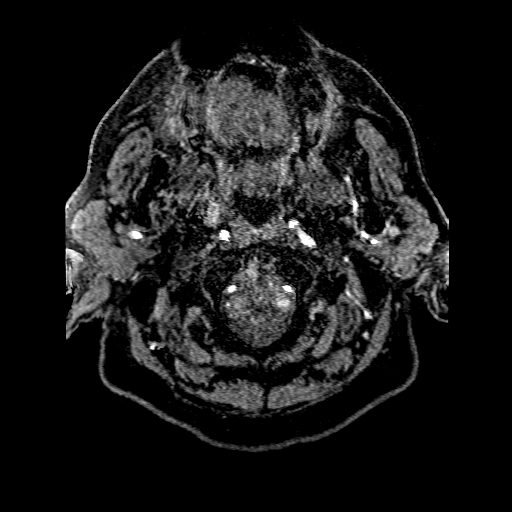
[im 12/188]
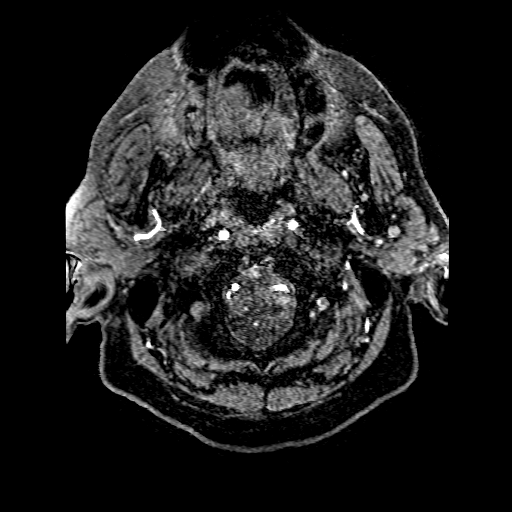
[im 16/188]
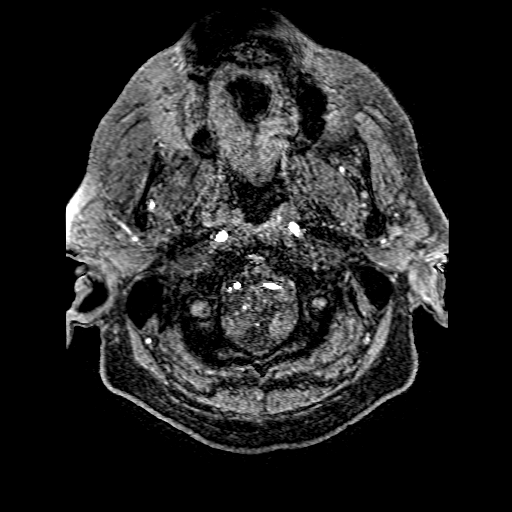
[im 20/188]
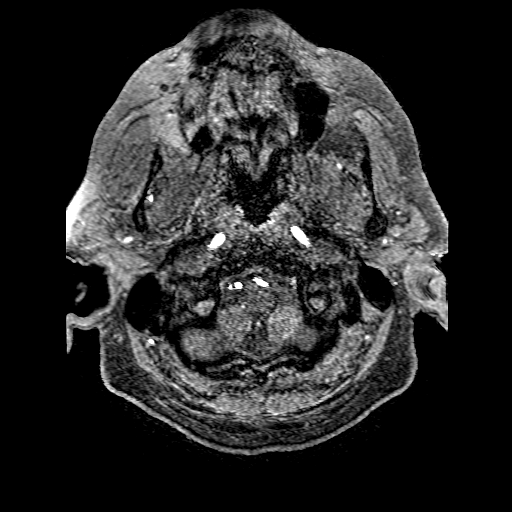
[im 24/188]
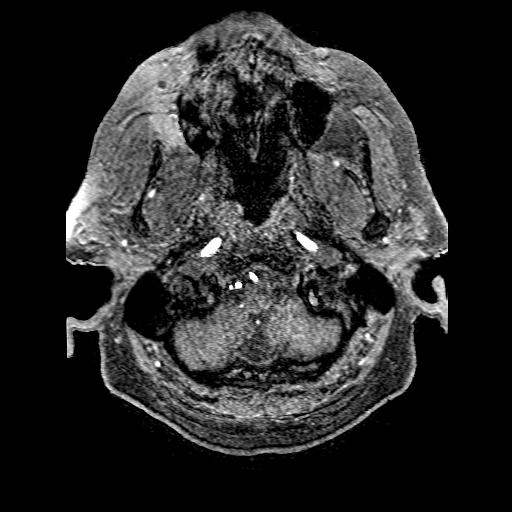
[im 28/188]
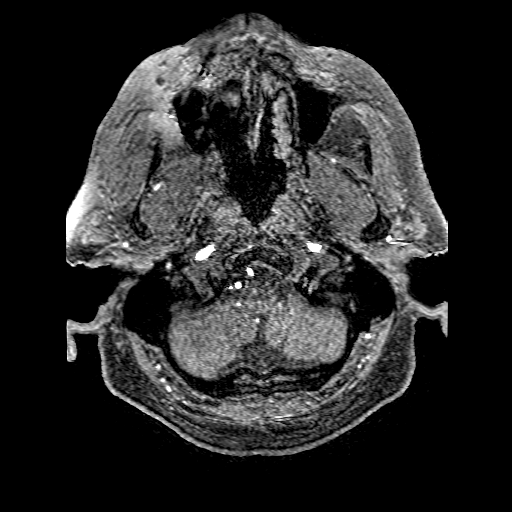
[im 32/188]
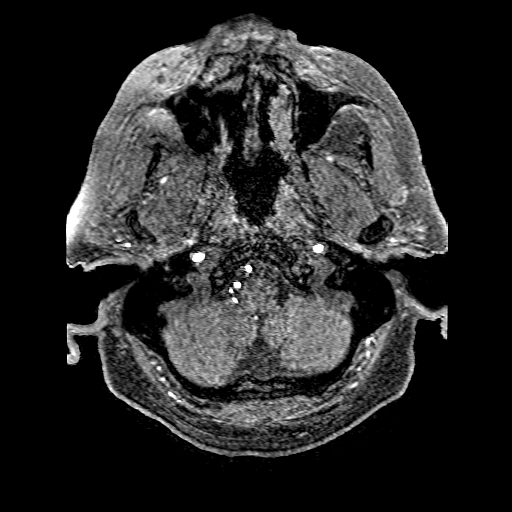
[im 36/188]
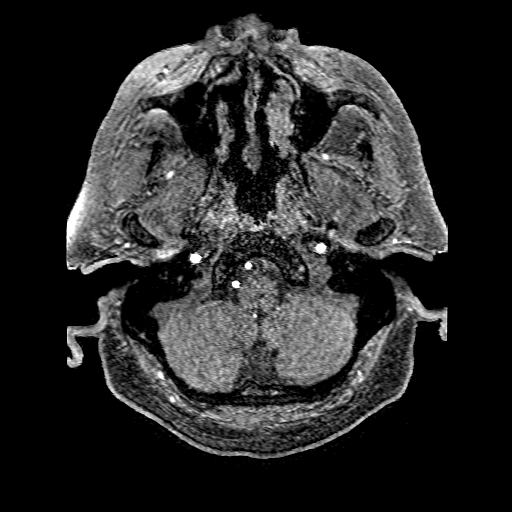
[im 40/188]
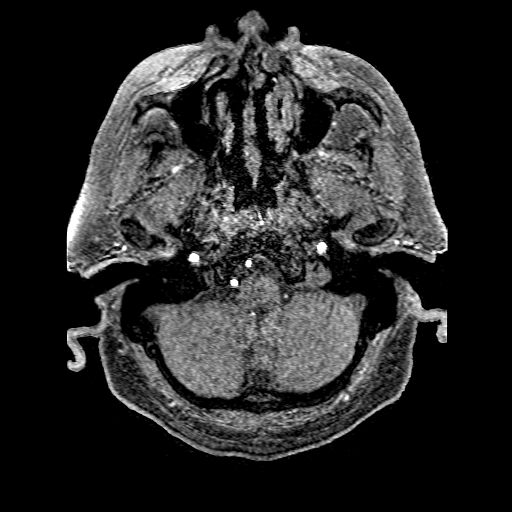
[im 60/188]
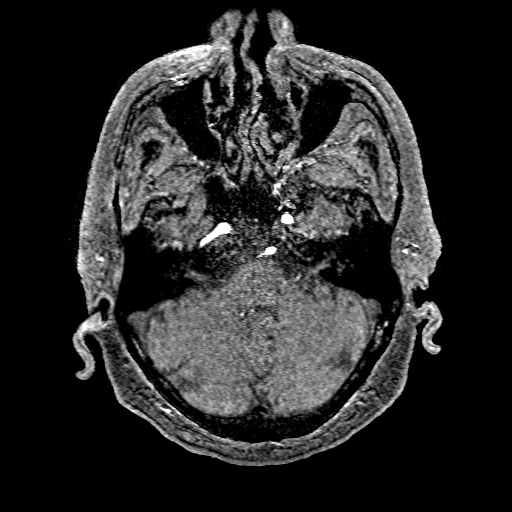
[im 84/188]
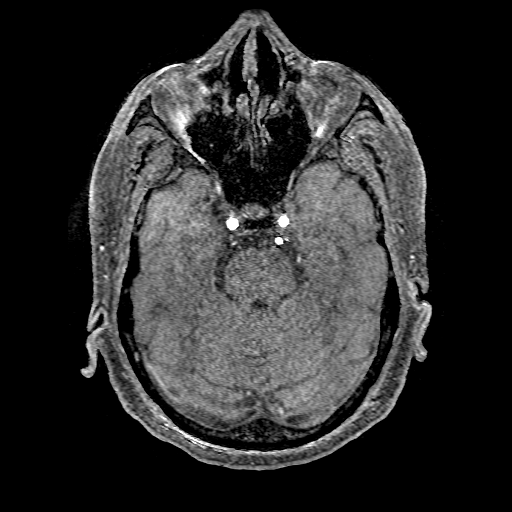
[im 96/188]
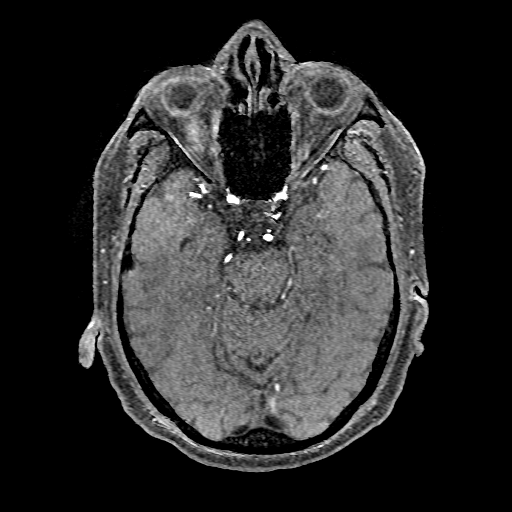
[im 108/188]
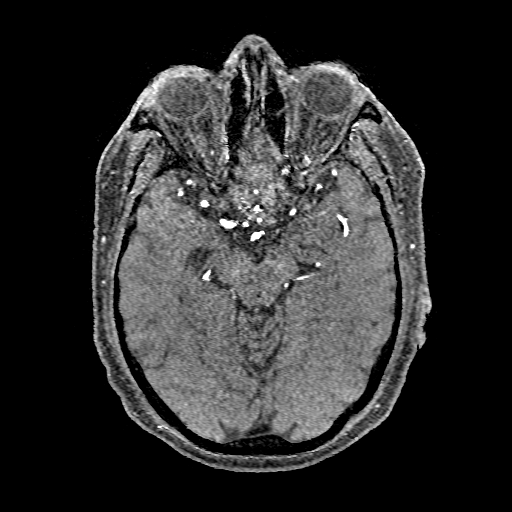
[im 132/188]
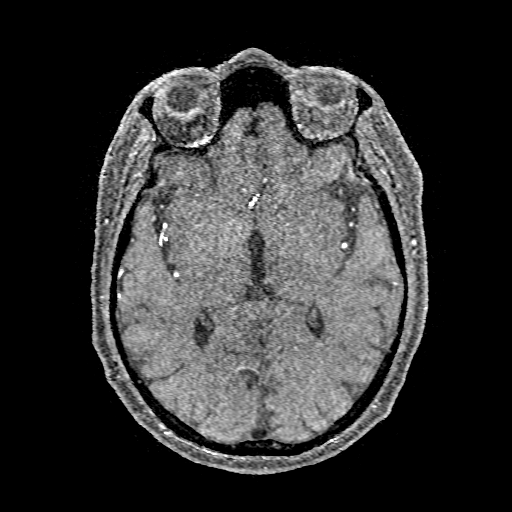
[im 156/188]
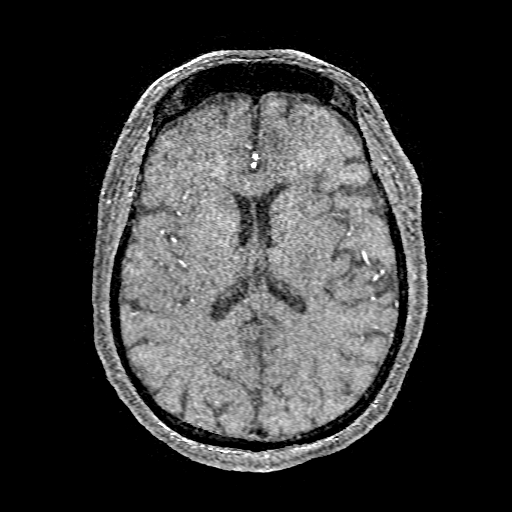
[im 160/188]
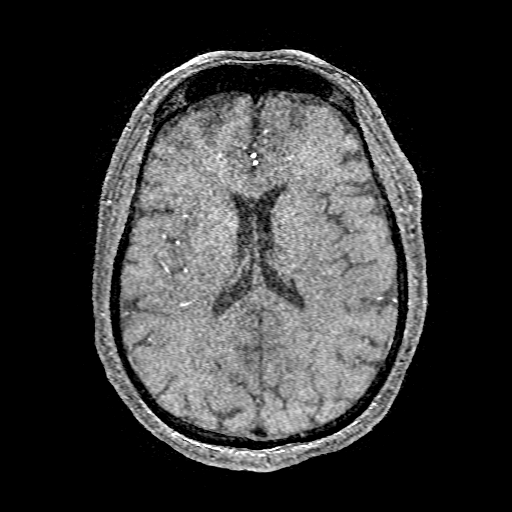
[im 180/188]
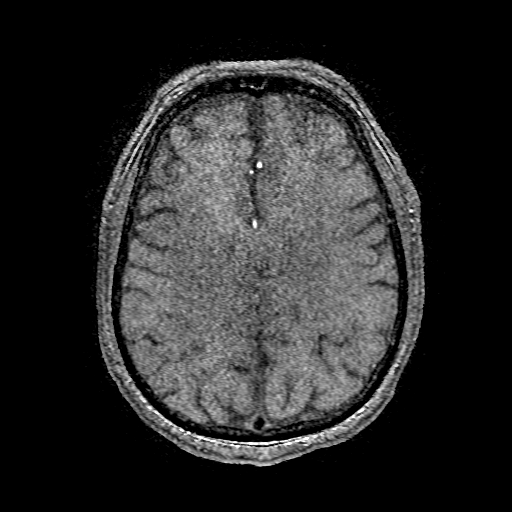

[19 of 48 positions shown; findings below may reference images not displayed]

FINDINGS: ANTERIOR CIRCULATION: Normal flow related enhancement of the
included cervical, petrous, cavernous and supraclinoid internal
carotid arteries. Patent anterior communicating artery. Poor flow
related artifact LEFT M2 inferior division though, normal caliber.
Focal loss of LEFT M2 superior division flow related enhancement,
patent distally. Occluded LEFT M3 segment within anterior sylvian
fissure. Patent anterior cerebral artery's.

No aneurysm.

POSTERIOR CIRCULATION: RIGHT vertebral artery is dominant. Basilar
artery is patent, with normal flow related enhancement of the main
branch vessels. Patent posterior cerebral arteries. LEFT posterior
communicating artery infundibulum.

No large vessel occlusion, flow limiting stenosis,  aneurysm.

ANATOMIC VARIANTS: None.

Source images and MIP images were reviewed.
IMPRESSION: 1. Severe stenosis versus focally occluded LEFT M2 segments,
possibly overestimated by tortuosity.
2. Occluded LEFT M3 segment.
